# Patient Record
Sex: Male | Born: 1955 | Race: White | Hispanic: No | Marital: Married | Smoking: Former smoker
Health system: Southern US, Community
[De-identification: ages and names within clinical notes are randomized; demographics above are authoritative.]

## PROBLEM LIST (undated history)

## (undated) DIAGNOSIS — G4700 Insomnia, unspecified: Secondary | ICD-10-CM

## (undated) DIAGNOSIS — I10 Essential (primary) hypertension: Secondary | ICD-10-CM

## (undated) DIAGNOSIS — E039 Hypothyroidism, unspecified: Secondary | ICD-10-CM

## (undated) DIAGNOSIS — K388 Other specified diseases of appendix: Secondary | ICD-10-CM

## (undated) DIAGNOSIS — E119 Type 2 diabetes mellitus without complications: Secondary | ICD-10-CM

## (undated) DIAGNOSIS — F329 Major depressive disorder, single episode, unspecified: Secondary | ICD-10-CM

## (undated) DIAGNOSIS — B182 Chronic viral hepatitis C: Secondary | ICD-10-CM

---

## 1999-06-24 ENCOUNTER — Encounter (INDEPENDENT_AMBULATORY_CARE_PROVIDER_SITE_OTHER): Payer: Self-pay | Admitting: Specialist

## 1999-06-24 ENCOUNTER — Ambulatory Visit (HOSPITAL_COMMUNITY): Admission: RE | Admit: 1999-06-24 | Discharge: 1999-06-24 | Payer: Self-pay | Admitting: Gastroenterology

## 2004-01-06 ENCOUNTER — Ambulatory Visit (HOSPITAL_COMMUNITY): Admission: RE | Admit: 2004-01-06 | Discharge: 2004-01-06 | Payer: Self-pay | Admitting: *Deleted

## 2004-01-06 ENCOUNTER — Encounter (INDEPENDENT_AMBULATORY_CARE_PROVIDER_SITE_OTHER): Payer: Self-pay | Admitting: *Deleted

## 2005-12-31 ENCOUNTER — Ambulatory Visit (HOSPITAL_COMMUNITY): Admission: RE | Admit: 2005-12-31 | Discharge: 2005-12-31 | Payer: Self-pay | Admitting: Gastroenterology

## 2005-12-31 ENCOUNTER — Encounter (INDEPENDENT_AMBULATORY_CARE_PROVIDER_SITE_OTHER): Payer: Self-pay | Admitting: Specialist

## 2006-03-07 ENCOUNTER — Ambulatory Visit (HOSPITAL_COMMUNITY): Admission: RE | Admit: 2006-03-07 | Discharge: 2006-03-07 | Payer: Self-pay | Admitting: Gastroenterology

## 2006-10-12 ENCOUNTER — Ambulatory Visit (HOSPITAL_COMMUNITY): Admission: RE | Admit: 2006-10-12 | Discharge: 2006-10-12 | Payer: Self-pay | Admitting: Gastroenterology

## 2010-01-16 ENCOUNTER — Encounter: Admission: RE | Admit: 2010-01-16 | Discharge: 2010-01-16 | Payer: Self-pay | Admitting: Internal Medicine

## 2010-01-29 ENCOUNTER — Inpatient Hospital Stay (HOSPITAL_COMMUNITY): Admission: EM | Admit: 2010-01-29 | Discharge: 2010-02-01 | Payer: Self-pay | Admitting: Emergency Medicine

## 2010-01-29 ENCOUNTER — Ambulatory Visit: Payer: Self-pay | Admitting: Pulmonary Disease

## 2010-02-06 ENCOUNTER — Encounter: Admission: RE | Admit: 2010-02-06 | Discharge: 2010-02-06 | Payer: Self-pay | Admitting: Gastroenterology

## 2010-02-25 ENCOUNTER — Encounter: Admission: RE | Admit: 2010-02-25 | Discharge: 2010-02-25 | Payer: Self-pay | Admitting: Gastroenterology

## 2011-03-19 LAB — URINALYSIS, ROUTINE W REFLEX MICROSCOPIC
Glucose, UA: >1000 mg/dL — AB
Ketones, ur: 15 mg/dL — AB
Leukocytes, UA: NEGATIVE
Nitrite: POSITIVE — AB
Protein, ur: 100 mg/dL — AB
Specific Gravity, Urine: 1.035 — ABNORMAL HIGH (ref 1.005–1.030)
Urobilinogen, UA: 4 mg/dL — ABNORMAL HIGH (ref 0.0–1.0)
pH: 5.5 (ref 5.0–8.0)

## 2011-03-19 LAB — CBC
HCT: 39.6 % (ref 39.0–52.0)
HCT: 41.4 % (ref 39.0–52.0)
HCT: 45.2 % (ref 39.0–52.0)
Hemoglobin: 13.7 g/dL (ref 13.0–17.0)
Hemoglobin: 14.5 g/dL (ref 13.0–17.0)
Hemoglobin: 16.3 g/dL (ref 13.0–17.0)
MCHC: 34.6 g/dL (ref 30.0–36.0)
MCHC: 35.1 g/dL (ref 30.0–36.0)
MCHC: 36 g/dL (ref 30.0–36.0)
MCV: 93.2 fL (ref 78.0–100.0)
MCV: 93.3 fL (ref 78.0–100.0)
MCV: 94.4 fL (ref 78.0–100.0)
Platelets: 108 10*3/uL — ABNORMAL LOW (ref 150–400)
Platelets: 125 10*3/uL — ABNORMAL LOW (ref 150–400)
RBC: 4.24 MIL/uL (ref 4.22–5.81)
RBC: 4.38 MIL/uL (ref 4.22–5.81)
RBC: 4.85 MIL/uL (ref 4.22–5.81)
RDW: 12.2 % (ref 11.5–15.5)
RDW: 12.9 % (ref 11.5–15.5)
WBC: 11.8 10*3/uL — ABNORMAL HIGH (ref 4.0–10.5)
WBC: 8.9 K/uL (ref 4.0–10.5)

## 2011-03-19 LAB — POCT I-STAT 3, ART BLOOD GAS (G3+)
Acid-base deficit: 2 mmol/L (ref 0.0–2.0)
Bicarbonate: 19.7 mEq/L — ABNORMAL LOW (ref 20.0–24.0)
O2 Saturation: 100 %
O2 Saturation: 100 %
TCO2: 20 mmol/L (ref 0–100)
TCO2: 28 mmol/L (ref 0–100)
pCO2 arterial: 22.4 mmHg — ABNORMAL LOW (ref 35.0–45.0)
pCO2 arterial: 56.5 mmHg — ABNORMAL HIGH (ref 35.0–45.0)
pO2, Arterial: 155 mmHg — ABNORMAL HIGH (ref 80.0–100.0)

## 2011-03-19 LAB — COMPREHENSIVE METABOLIC PANEL
ALT: 78 U/L — ABNORMAL HIGH (ref 0–53)
ALT: 83 U/L — ABNORMAL HIGH (ref 0–53)
ALT: 97 U/L — ABNORMAL HIGH (ref 0–53)
AST: 60 U/L — ABNORMAL HIGH (ref 0–37)
Albumin: 2.8 g/dL — ABNORMAL LOW (ref 3.5–5.2)
Albumin: 3 g/dL — ABNORMAL LOW (ref 3.5–5.2)
Alkaline Phosphatase: 58 U/L (ref 39–117)
Alkaline Phosphatase: 63 U/L (ref 39–117)
BUN: 19 mg/dL (ref 6–23)
CO2: 20 mEq/L (ref 19–32)
CO2: 24 mEq/L (ref 19–32)
Calcium: 8.2 mg/dL — ABNORMAL LOW (ref 8.4–10.5)
Calcium: 8.6 mg/dL (ref 8.4–10.5)
Chloride: 110 mEq/L (ref 96–112)
Creatinine, Ser: 0.85 mg/dL (ref 0.4–1.5)
GFR calc Af Amer: >60 mL/min (ref >60)
GFR calc Af Amer: >60 mL/min (ref >60)
GFR calc non Af Amer: >60 mL/min (ref >60)
Glucose, Bld: 227 mg/dL — ABNORMAL HIGH (ref 70–99)
Glucose, Bld: 472 mg/dL — ABNORMAL HIGH (ref 70–99)
Potassium: 2.6 mEq/L — CL (ref 3.5–5.1)
Potassium: 3.4 mEq/L — ABNORMAL LOW (ref 3.5–5.1)
Sodium: 135 mEq/L (ref 135–145)
Sodium: 141 mEq/L (ref 135–145)
Total Bilirubin: 1 mg/dL (ref 0.3–1.2)
Total Protein: 5.9 g/dL — ABNORMAL LOW (ref 6.0–8.3)

## 2011-03-19 LAB — BASIC METABOLIC PANEL
BUN: 10 mg/dL (ref 6–23)
BUN: 15 mg/dL (ref 6–23)
CO2: 26 mEq/L (ref 19–32)
Calcium: 8.3 mg/dL — ABNORMAL LOW (ref 8.4–10.5)
Chloride: 111 mEq/L (ref 96–112)
GFR calc Af Amer: >60 mL/min (ref >60)
GFR calc non Af Amer: >60 mL/min (ref >60)
Glucose, Bld: 149 mg/dL — ABNORMAL HIGH (ref 70–99)
Glucose, Bld: 247 mg/dL — ABNORMAL HIGH (ref 70–99)
Potassium: 3.1 mEq/L — ABNORMAL LOW (ref 3.5–5.1)
Sodium: 141 mEq/L (ref 135–145)

## 2011-03-19 LAB — GLUCOSE, CAPILLARY
Glucose-Capillary: 145 mg/dL — ABNORMAL HIGH (ref 70–99)
Glucose-Capillary: 147 mg/dL — ABNORMAL HIGH (ref 70–99)
Glucose-Capillary: 158 mg/dL — ABNORMAL HIGH (ref 70–99)
Glucose-Capillary: 158 mg/dL — ABNORMAL HIGH (ref 70–99)
Glucose-Capillary: 169 mg/dL — ABNORMAL HIGH (ref 70–99)
Glucose-Capillary: 170 mg/dL — ABNORMAL HIGH (ref 70–99)
Glucose-Capillary: 171 mg/dL — ABNORMAL HIGH (ref 70–99)
Glucose-Capillary: 181 mg/dL — ABNORMAL HIGH (ref 70–99)
Glucose-Capillary: 201 mg/dL — ABNORMAL HIGH (ref 70–99)
Glucose-Capillary: 215 mg/dL — ABNORMAL HIGH (ref 70–99)
Glucose-Capillary: 234 mg/dL — ABNORMAL HIGH (ref 70–99)
Glucose-Capillary: 236 mg/dL — ABNORMAL HIGH (ref 70–99)
Glucose-Capillary: 261 mg/dL — ABNORMAL HIGH (ref 70–99)
Glucose-Capillary: 273 mg/dL — ABNORMAL HIGH (ref 70–99)
Glucose-Capillary: 286 mg/dL — ABNORMAL HIGH (ref 70–99)
Glucose-Capillary: 301 mg/dL — ABNORMAL HIGH (ref 70–99)
Glucose-Capillary: 311 mg/dL — ABNORMAL HIGH (ref 70–99)
Glucose-Capillary: 324 mg/dL — ABNORMAL HIGH (ref 70–99)
Glucose-Capillary: 356 mg/dL — ABNORMAL HIGH (ref 70–99)
Glucose-Capillary: 360 mg/dL — ABNORMAL HIGH (ref 70–99)
Glucose-Capillary: 361 mg/dL — ABNORMAL HIGH (ref 70–99)

## 2011-03-19 LAB — COMPREHENSIVE METABOLIC PANEL WITH GFR
AST: 58 U/L — ABNORMAL HIGH (ref 0–37)
BUN: 14 mg/dL (ref 6–23)
BUN: 17 mg/dL (ref 6–23)
CO2: 23 meq/L (ref 19–32)
Calcium: 8.1 mg/dL — ABNORMAL LOW (ref 8.4–10.5)
Chloride: 107 meq/L (ref 96–112)
Creatinine, Ser: 0.66 mg/dL (ref 0.4–1.5)
Creatinine, Ser: 0.8 mg/dL (ref 0.4–1.5)
GFR calc non Af Amer: >60 mL/min (ref >60)
GFR calc non Af Amer: >60 mL/min (ref >60)
Glucose, Bld: 128 mg/dL — ABNORMAL HIGH (ref 70–99)
Total Bilirubin: 1.1 mg/dL (ref 0.3–1.2)
Total Protein: 5.8 g/dL — ABNORMAL LOW (ref 6.0–8.3)

## 2011-03-19 LAB — URINE MICROSCOPIC-ADD ON

## 2011-03-19 LAB — URINE CULTURE
Colony Count: NO GROWTH
Culture: NO GROWTH
Culture: NO GROWTH

## 2011-03-19 LAB — BASIC METABOLIC PANEL WITH GFR
CO2: 21 meq/L (ref 19–32)
Calcium: 7.9 mg/dL — ABNORMAL LOW (ref 8.4–10.5)
Creatinine, Ser: 0.78 mg/dL (ref 0.4–1.5)
Potassium: 3.7 meq/L (ref 3.5–5.1)
Sodium: 137 meq/L (ref 135–145)

## 2011-03-19 LAB — HEMOGLOBIN A1C
Hgb A1c MFr Bld: 11.9 % — ABNORMAL HIGH (ref 4.6–6.1)
Mean Plasma Glucose: 295 mg/dL

## 2011-03-19 LAB — POCT I-STAT, CHEM 8
BUN: 31 mg/dL — ABNORMAL HIGH (ref 6–23)
Calcium, Ion: 1.1 mmol/L — ABNORMAL LOW (ref 1.12–1.32)
Chloride: 108 mEq/L (ref 96–112)
Glucose, Bld: 524 mg/dL — ABNORMAL HIGH (ref 70–99)

## 2011-03-19 LAB — DIFFERENTIAL
Eosinophils Absolute: 0.1 10*3/uL (ref 0.0–0.7)
Lymphs Abs: 1.2 10*3/uL (ref 0.7–4.0)
Neutrophils Relative %: 80 % — ABNORMAL HIGH (ref 43–77)

## 2011-03-19 LAB — RAPID URINE DRUG SCREEN, HOSP PERFORMED: Tetrahydrocannabinol: NOT DETECTED

## 2011-03-19 LAB — AMMONIA
Ammonia: 101 umol/L — ABNORMAL HIGH (ref 11–35)
Ammonia: 37 umol/L — ABNORMAL HIGH (ref 11–35)
Ammonia: 43 umol/L — ABNORMAL HIGH (ref 11–35)
Ammonia: 71 umol/L — ABNORMAL HIGH (ref 11–35)

## 2011-03-19 LAB — MRSA PCR SCREENING: MRSA by PCR: NEGATIVE

## 2011-03-19 LAB — LACTIC ACID, PLASMA: Lactic Acid, Venous: 5.4 mmol/L — ABNORMAL HIGH (ref 0.5–2.2)

## 2011-03-19 LAB — TSH: TSH: 1.866 u[IU]/mL (ref 0.350–4.500)

## 2011-03-19 LAB — PROTIME-INR: Prothrombin Time: 13.3 seconds (ref 11.6–15.2)

## 2011-03-19 LAB — LIPASE, BLOOD: Lipase: 68 U/L — ABNORMAL HIGH (ref 11–59)

## 2011-05-14 NOTE — Op Note (Signed)
[name], [name]                ACCOUNT NO.:  [id number]   MEDICAL RECORD NO.:  [id number]          PATIENT TYPE:  AMB   LOCATION:  ENDO                         FACILITY:  [facility]   PHYSICIAN:  Shirley Friar, MDDATE OF BIRTH:  1956-03-24   DATE OF PROCEDURE:  12/31/2005  DATE OF DISCHARGE:                                 OPERATIVE REPORT   PROCEDURE:  Sigmoidoscopy.   INDICATIONS FOR PROCEDURE:  Chronic diarrhea.   MEDICATIONS:  None.   FINDINGS:  Rectal exam was normal. A pediatric adjustable colonoscope was  inserted and advanced to 60 cm. On careful withdrawal, the colonoscope  revealed scattered ulcers, inflammation and edema in the rectum up to  approximately 10 cm consistent with proctitis. The mucosal lining was  otherwise normal on the left side. Random biopsies were taken to rule out  colitis.   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   PLAN:  1.  Start Rowasa enemas 4 grams q.h.s.  2.  Followup on path.  3.  Followup in my office in 3-4 weeks and my office will schedule.      Shirley Friar, MD  Electronically Signed     VCS/MEDQ  D:  12/31/2005  T:  12/31/2005  Job:  [id number]   cc:   Erskine Speed, M.D.  Fax: [phone]

## 2012-07-31 ENCOUNTER — Other Ambulatory Visit: Payer: Self-pay | Admitting: Gastroenterology

## 2013-11-14 ENCOUNTER — Other Ambulatory Visit: Payer: Self-pay | Admitting: Internal Medicine

## 2013-11-14 DIAGNOSIS — B1920 Unspecified viral hepatitis C without hepatic coma: Secondary | ICD-10-CM

## 2013-11-20 ENCOUNTER — Ambulatory Visit
Admission: RE | Admit: 2013-11-20 | Discharge: 2013-11-20 | Disposition: A | Discharge status: Home / Self Care | Payer: 59 | Source: Ambulatory Visit | Attending: Internal Medicine | Admitting: Internal Medicine

## 2013-11-20 DIAGNOSIS — B1920 Unspecified viral hepatitis C without hepatic coma: Secondary | ICD-10-CM

## 2014-03-13 ENCOUNTER — Other Ambulatory Visit: Payer: Self-pay | Admitting: Nurse Practitioner

## 2014-03-13 DIAGNOSIS — C220 Liver cell carcinoma: Secondary | ICD-10-CM

## 2014-06-19 ENCOUNTER — Encounter (INDEPENDENT_AMBULATORY_CARE_PROVIDER_SITE_OTHER): Payer: Self-pay

## 2014-06-19 ENCOUNTER — Ambulatory Visit
Admission: RE | Admit: 2014-06-19 | Discharge: 2014-06-19 | Disposition: A | Discharge status: Home / Self Care | Payer: 59 | Source: Ambulatory Visit | Attending: Nurse Practitioner | Admitting: Nurse Practitioner

## 2014-06-19 DIAGNOSIS — C220 Liver cell carcinoma: Secondary | ICD-10-CM

## 2014-08-28 ENCOUNTER — Other Ambulatory Visit: Payer: Self-pay | Admitting: Nurse Practitioner

## 2014-08-28 DIAGNOSIS — C220 Liver cell carcinoma: Secondary | ICD-10-CM

## 2014-11-27 ENCOUNTER — Ambulatory Visit
Admission: RE | Admit: 2014-11-27 | Discharge: 2014-11-27 | Disposition: A | Discharge status: Home / Self Care | Payer: 59 | Source: Ambulatory Visit | Attending: Nurse Practitioner | Admitting: Nurse Practitioner

## 2014-11-27 DIAGNOSIS — C220 Liver cell carcinoma: Secondary | ICD-10-CM

## 2015-03-26 ENCOUNTER — Other Ambulatory Visit (HOSPITAL_COMMUNITY): Payer: Self-pay | Admitting: Nurse Practitioner

## 2015-03-26 DIAGNOSIS — B182 Chronic viral hepatitis C: Secondary | ICD-10-CM

## 2015-04-10 ENCOUNTER — Ambulatory Visit (HOSPITAL_COMMUNITY)
Admission: RE | Admit: 2015-04-10 | Discharge: 2015-04-10 | Disposition: A | Discharge status: Home / Self Care | Payer: 59 | Source: Ambulatory Visit | Attending: Nurse Practitioner | Admitting: Nurse Practitioner

## 2015-04-10 DIAGNOSIS — K824 Cholesterolosis of gallbladder: Secondary | ICD-10-CM | POA: Diagnosis not present

## 2015-04-10 DIAGNOSIS — B182 Chronic viral hepatitis C: Secondary | ICD-10-CM | POA: Diagnosis not present

## 2016-02-15 ENCOUNTER — Emergency Department (INDEPENDENT_AMBULATORY_CARE_PROVIDER_SITE_OTHER): Payer: 59

## 2016-02-15 ENCOUNTER — Emergency Department (HOSPITAL_COMMUNITY)
Admission: EM | Admit: 2016-02-15 | Discharge: 2016-02-15 | Disposition: A | Discharge status: Home / Self Care | Payer: 59 | Source: Home / Self Care | Attending: Family Medicine | Admitting: Family Medicine

## 2016-02-15 ENCOUNTER — Encounter (HOSPITAL_COMMUNITY): Payer: Self-pay | Admitting: Emergency Medicine

## 2016-02-15 DIAGNOSIS — M79602 Pain in left arm: Secondary | ICD-10-CM

## 2016-02-15 DIAGNOSIS — M79605 Pain in left leg: Secondary | ICD-10-CM | POA: Diagnosis not present

## 2016-02-15 HISTORY — DX: Chronic viral hepatitis C: B18.2

## 2016-02-15 HISTORY — DX: Type 2 diabetes mellitus without complications: E11.9

## 2016-02-15 MED ORDER — KETOROLAC TROMETHAMINE 60 MG/2ML IM SOLN
INTRAMUSCULAR | Status: AC
  Filled 2016-02-15: qty 2

## 2016-02-15 MED ORDER — KETOROLAC TROMETHAMINE 60 MG/2ML IM SOLN
60.0000 mg | Freq: Once | INTRAMUSCULAR | Status: AC
  Administered 2016-02-15: 60 mg via INTRAMUSCULAR

## 2016-02-15 MED ORDER — NAPROXEN 500 MG PO TABS: 500.0000 mg | ORAL_TABLET | Freq: Two times a day (BID) | ORAL | Status: DC

## 2016-02-15 NOTE — Discharge Instructions (Signed)
It was a pleasure to see you today for the pain in your left leg.  The x-ray does not show evidence of a fracture.  I suspect you have had a small bleed into the muscle in your left lower leg.  NSAIDs, such as NAPROXEN 500mg  tablets, 1 tablet by mouth every 12 hours (start this evening, as you received an injectable NSAID-- Toradol 60mg -- in the [facility] [place]).   Warm compresses to the area periodically.  Follow up with Dr Nyoka Cowden this week as needed if the area becomes larger, more painful, or with other concerns or problems.
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
## 2016-06-24 ENCOUNTER — Other Ambulatory Visit: Payer: Self-pay | Admitting: Nurse Practitioner

## 2016-06-24 DIAGNOSIS — K7469 Other cirrhosis of liver: Secondary | ICD-10-CM

## 2016-07-06 ENCOUNTER — Ambulatory Visit
Admission: RE | Admit: 2016-07-06 | Discharge: 2016-07-06 | Disposition: A | Discharge status: Home / Self Care | Payer: 59 | Source: Ambulatory Visit | Attending: Nurse Practitioner | Admitting: Nurse Practitioner

## 2016-07-06 DIAGNOSIS — K7469 Other cirrhosis of liver: Secondary | ICD-10-CM

## 2016-09-17 ENCOUNTER — Other Ambulatory Visit: Payer: Self-pay | Admitting: Internal Medicine

## 2016-09-17 DIAGNOSIS — R079 Chest pain, unspecified: Secondary | ICD-10-CM

## 2016-09-20 ENCOUNTER — Ambulatory Visit
Admission: RE | Admit: 2016-09-20 | Discharge: 2016-09-20 | Disposition: A | Discharge status: Home / Self Care | Payer: 59 | Source: Ambulatory Visit | Attending: Internal Medicine | Admitting: Internal Medicine

## 2016-09-20 DIAGNOSIS — R079 Chest pain, unspecified: Secondary | ICD-10-CM

## 2016-12-29 ENCOUNTER — Other Ambulatory Visit: Payer: Self-pay | Admitting: Nurse Practitioner

## 2016-12-29 DIAGNOSIS — K7469 Other cirrhosis of liver: Secondary | ICD-10-CM

## 2016-12-31 ENCOUNTER — Ambulatory Visit
Admission: RE | Admit: 2016-12-31 | Discharge: 2016-12-31 | Disposition: A | Discharge status: Home / Self Care | Payer: 59 | Source: Ambulatory Visit | Attending: Nurse Practitioner | Admitting: Nurse Practitioner

## 2016-12-31 DIAGNOSIS — K7469 Other cirrhosis of liver: Secondary | ICD-10-CM

## 2017-05-20 ENCOUNTER — Ambulatory Visit (INDEPENDENT_AMBULATORY_CARE_PROVIDER_SITE_OTHER): Payer: 59 | Admitting: Internal Medicine

## 2017-05-20 ENCOUNTER — Encounter: Payer: 59 | Admitting: Internal Medicine

## 2017-05-20 DIAGNOSIS — Z23 Encounter for immunization: Secondary | ICD-10-CM | POA: Diagnosis not present

## 2017-05-20 DIAGNOSIS — Z789 Other specified health status: Secondary | ICD-10-CM | POA: Diagnosis not present

## 2017-05-20 DIAGNOSIS — Z9189 Other specified personal risk factors, not elsewhere classified: Secondary | ICD-10-CM

## 2017-05-20 DIAGNOSIS — Z7189 Other specified counseling: Secondary | ICD-10-CM | POA: Diagnosis not present

## 2017-05-20 DIAGNOSIS — Z7184 Encounter for health counseling related to travel: Secondary | ICD-10-CM | POA: Insufficient documentation

## 2017-05-20 DIAGNOSIS — Z0000 Encounter for general adult medical examination without abnormal findings: Secondary | ICD-10-CM

## 2017-05-20 MED ORDER — ATOVAQUONE-PROGUANIL HCL 250-100 MG PO TABS
1.0000 | ORAL_TABLET | Freq: Every day | ORAL | 0 refills | Status: DC
Start: 2017-05-20 — End: 2018-03-13

## 2017-05-20 MED ORDER — CIPROFLOXACIN HCL 500 MG PO TABS: 500.0000 mg | ORAL_TABLET | Freq: Two times a day (BID) | ORAL | 0 refills | Status: DC

## 2017-05-20 NOTE — Patient Instructions (Signed)
[facility]                [address], [place]                   [place],  [address]                      Phone: [phone]                        Fax: [phone]   Planned departure date: July 2018          Planned return date: 2 weeks Countries of travel: [place]   Guidelines for the Prevention & Treatment of Traveler's Diarrhea  Prevention: "Boil it, Peel it, Lacinda Axon it, or Forget it"   the fewer chances -> lower risk: try to stick to food & water precautions as much as possible"   If it's "piping hot"; it is probably okay, if not, it may not be   Treatment   1) You should always take care to drink lots of fluids in order to avoid dehydration   2) You should bring medications with you in case you come down with a case of diarrhea   3) OTC = bring pepto-bismol - can take with initial abdominal symptoms;                    Imodium - can help slow down your intestinal tract, can help relief cramps                    and diarrhea, can take if no bloody diarrhea  Use ciprofloxacin if needed for traveler's diarrhea  Guidelines for the Prevention of Malaria  Avoidance:  -fewer mosquito bites = lower risk. Mosquitos can bite at night as well as daytime  -cover up (long sleeve clothing), mosquito nets, screens  -Insect repellent for your skin ( DEET containing lotion > 20%): for clothes ( permethrin spray)   2 days prior to travel, start malarone, daily dose starting 1-2 days before entering endemic area, ending 7 days after leaving area for malaria prevention.   Immunizations received today: Hepatitis A series, Td, Typhoid (parenteral) and will get yellow fever  Future immunizations, if indicated Hepatitis A series in 6-12 months after November 20, 2017   Prior to travel:  1) Be sure to pick up appropriate prescriptions, including medicine you take daily. Do not expect to be able to fill your prescriptions abroad.  2)  Strongly consider obtaining traveler's insurance, including emergency evacuation insurance. Most plans in the [place] do not cover participants abroad. (see below for resources)  3) Register at the appropriate [place] embassy or consulate with travel dates so they are aware of your presence in-country and for helpful advice during travel using the [employer] (STEP, GreenNylon.com.cy).  4) Leave contact information with a relative or friend.  5) Keep a Research officer, political party, credit cards in case they become lost or stolen  6) Inform your credit card company that you will be travelling abroad   During travel:  1) If you become ill and need medical advice, the [place] [employer]	[place] of the country you are traveling in general provides a list of Dick[place] speaking doctors.  We are also available on MyChart for remote consultation if you register prior to travel. 2) Avoid motorcycles or scooters when at all  possible. Traffic laws in many countries are lax and accidents occur frequently.  3) Do not take any unnecessary risks that you wouldn't do at home.   Resources:  -Country specific information: BlindResource.ca or GreenNylon.com.cy  -Press photographer (DEET, mosquito nets): [employer], [employer] store, [employer], [place] insurance options: gatewayplans.com; http://clayton-rivera.info/; travelguard.com or [employer], gninsurance.com or info@gninsurance .com, [id number].   Post Travel:  If you return from your trip ill, call your primary care doctor or our travel clinic @ [phone].   Enjoy your trip and know that with proper pre-travel preparation, most people have an enjoyable and uninterrupted trip!

## 2017-05-20 NOTE — Progress Notes (Signed)
Subjective:   [name] [name] is a 61 y.o. male who presents to the Infectious Disease clinic for travel consultation. Planned departure date: July 2018          Planned return date: 2 weeks Countries of travel: [place] Areas in country: urban   Accommodations: private home Purpose of travel: vacation Prior travel out of [place]: yes     Objective:   Medications: reviewed    Assessment:   No contraindications to travel none     Plan:    Issues discussed: environmental concerns, freshwater swimming, future shots, insect-borne illnesses, malaria, MVA safety, rabies, safe food/water, traveler's diarrhea, website/handouts for more information, what to do if ill upon return, what to do if ill while there and Yellow Fever. Immunizations recommended: Hepatitis A series, Td, Typhoid (parenteral) and Yellow Fever. Malaria prophylaxis: malarone, daily dose starting 1-2 days before entering endemic area, ending 7 days after leaving area Traveler's diarrhea prophylaxis: ciprofloxacin. Total duration of visit: 1 Hour. Total time spent on education, counseling, coordination of care: 30 Minutes.

## 2017-07-28 ENCOUNTER — Other Ambulatory Visit: Payer: Self-pay | Admitting: Nurse Practitioner

## 2017-07-28 DIAGNOSIS — K7469 Other cirrhosis of liver: Secondary | ICD-10-CM

## 2017-08-23 ENCOUNTER — Ambulatory Visit
Admission: RE | Admit: 2017-08-23 | Discharge: 2017-08-23 | Disposition: A | Discharge status: Home / Self Care | Payer: 59 | Source: Ambulatory Visit | Attending: Nurse Practitioner | Admitting: Nurse Practitioner

## 2017-08-23 DIAGNOSIS — K7469 Other cirrhosis of liver: Secondary | ICD-10-CM

## 2017-12-27 HISTORY — PX: COLONOSCOPY: SHX174

## 2018-01-10 ENCOUNTER — Other Ambulatory Visit: Payer: Self-pay | Admitting: Gastroenterology

## 2018-01-10 DIAGNOSIS — K51219 Ulcerative (chronic) proctitis with unspecified complications: Secondary | ICD-10-CM

## 2018-01-22 ENCOUNTER — Other Ambulatory Visit: Payer: Self-pay | Admitting: Nurse Practitioner

## 2018-01-22 DIAGNOSIS — K7469 Other cirrhosis of liver: Secondary | ICD-10-CM

## 2018-01-27 ENCOUNTER — Ambulatory Visit
Admission: RE | Admit: 2018-01-27 | Discharge: 2018-01-27 | Disposition: A | Discharge status: Home / Self Care | Payer: 59 | Source: Ambulatory Visit | Attending: Nurse Practitioner | Admitting: Nurse Practitioner

## 2018-01-27 DIAGNOSIS — K7469 Other cirrhosis of liver: Secondary | ICD-10-CM

## 2018-01-31 ENCOUNTER — Ambulatory Visit
Admission: RE | Admit: 2018-01-31 | Discharge: 2018-01-31 | Disposition: A | Discharge status: Home / Self Care | Payer: 59 | Source: Ambulatory Visit | Attending: Gastroenterology | Admitting: Gastroenterology

## 2018-01-31 DIAGNOSIS — K51219 Ulcerative (chronic) proctitis with unspecified complications: Secondary | ICD-10-CM

## 2018-01-31 MED ORDER — IOPAMIDOL (ISOVUE-300) INJECTION 61%
125.0000 mL | Freq: Once | INTRAVENOUS | Status: AC | PRN
  Administered 2018-01-31: 125 mL via INTRAVENOUS

## 2018-02-20 ENCOUNTER — Ambulatory Visit: Payer: Self-pay | Admitting: Surgery

## 2018-02-20 ENCOUNTER — Encounter: Payer: Self-pay | Admitting: Surgery

## 2018-02-20 DIAGNOSIS — K388 Other specified diseases of appendix: Secondary | ICD-10-CM

## 2018-02-20 HISTORY — DX: Other specified diseases of appendix: K38.8

## 2018-02-20 NOTE — H&P (Signed)
Javoni Lucken Koskinen Documented: 02/20/2018 3:02 PM Location: [facility] Patient #: [id number] DOB: 11/24/1956 Married / Language: Cleophus Molt / Race: White Male  History of Present Illness Adin Hector MD; 02/20/2018 3:45 PM) The patient is a 62 year old male who presents with a colonic mass. Note for "Colonic mass": ` ` ` Patient sent for surgical consultation at the request of Dr. 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 CT scan suspicious for a mucocele. Rather large. Surgical consultation requested. He has a history of colon polyps. 5 years ago, no major issues. 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 No medications are treatments for that. He's never had abdominal surgery. He walks rather regularly. 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 Moves his bowels most days. He does not smoke. He is a diabetic controlled with oral hypoglycemics. Hemoglobin A1c in the mid fives.  No personal nor family history of GI/colon cancer, Crohn's disease, allergy such as Celiac Sprue, dietary/dairy problems, ulcers, nor gastritis. No recent sick contacts/gastroenteritis. No travel outside the country. No changes in diet. No dysphagia to solids or liquids. No significant heartburn or reflux. No hematochezia, hematemesis, coffee ground emesis. No evidence of prior gastric/peptic ulceration.  (Review of systems as stated in this history (HPI) or in the review of systems. Otherwise all other 12 point ROS are negative)   Past Surgical History Levonne Spiller, [place]; 02/20/2018 3:02 PM) Colon Polyp Removal - Colonoscopy Oral Surgery Vasectomy  Diagnostic Studies History Levonne Spiller, CMA; 02/20/2018 3:02 PM) Colonoscopy  within last year  Allergies Levonne Spiller, [place]; 02/20/2018 3:02 PM) No Known Drug Allergies [02/20/2018]: Allergies Reconciled  Medication History Levonne Spiller, CMA; 02/20/2018 3:04 PM) ClonazePAM (0.5MG  Tablet, Oral) Active. BuPROPion HCl ER (XL) (300MG  Tablet ER 24HR, Oral) Active. Lisinopril (10MG  Tablet, Oral) Active. MetFORMIN HCl (500MG  Tablet, Oral) Active. Synthroid (88MCG Tablet, Oral) Active. Naproxen (250MG  Tablet, Oral) Active. Viagra (100MG  Tablet, Oral) Active. Excedrin Extra Strength (250-250-65MG  Tablet, Oral) Active. Medications Reconciled  Social History Andee Poles Education officer, museum, CMA; 02/20/2018 3:02 PM) Alcohol use Remotely quit alcohol use. Caffeine use Carbonated beverages, Coffee. Illicit drug use Uses socially only. Tobacco use Former smoker.  Family History Levonne Spiller, CMA; 02/20/2018 3:02 PM) Alcohol Abuse Brother, Sister. Cancer Brother. Diabetes Mellitus Father. Heart Disease Father. Hypertension Brother. Respiratory Condition Father, Mother.  Other Problems Levonne Spiller, CMA; 02/20/2018 3:02 PM) Anxiety Disorder Cirrhosis Of Liver Depression Diabetes Mellitus Hepatitis Seizure Disorder     Review of Systems Andee Poles Gerrigner CMA; 02/20/2018 3:02 PM) General Not Present- Appetite Loss, Chills, Fatigue, Fever, Night Sweats, Weight Gain and Weight Loss. Skin Present- Dryness. Not Present- Change in Wart/Mole, Hives, Jaundice, New Lesions, Non-Healing Wounds, Rash and Ulcer. HEENT Present- Seasonal Allergies and Wears glasses/contact lenses. Not Present- Earache, Hearing Loss, Hoarseness, Nose Bleed, Oral Ulcers, Ringing in the Ears, Sinus Pain, Sore Throat, Visual Disturbances and Yellow Eyes. Respiratory Present- Snoring. Not Present- Bloody sputum, Chronic Cough, Difficulty Breathing and Wheezing. Breast Not Present- Breast Mass, Breast Pain, Nipple Discharge and Skin Changes. Cardiovascular Not  Present- Chest Pain, Difficulty Breathing Lying Down, Leg Cramps, Palpitations, Rapid Heart Rate, Shortness of Breath and Swelling of Extremities. Gastrointestinal Not Present- Abdominal Pain, Bloating, Bloody Stool, Change in Bowel Habits, Chronic diarrhea, Constipation, Difficulty Swallowing, Excessive  gas, Gets full quickly at meals, Hemorrhoids, Indigestion, Nausea, Rectal Pain and Vomiting. Male Genitourinary Present- Impotence. Not Present- Blood in Urine, Change in Urinary Stream, Frequency, Nocturia, Painful Urination, Urgency and Urine Leakage. Musculoskeletal Not Present- Back Pain, Joint Pain, Joint Stiffness, Muscle Pain, Muscle Weakness and Swelling of Extremities. Neurological Present- Headaches. Not Present- Decreased Memory, Fainting, Numbness, Seizures, Tingling, Tremor, Trouble walking and Weakness. Psychiatric Present- Anxiety and Depression. Not Present- Bipolar, Change in Sleep Pattern, Fearful and Frequent crying. Hematology Present- Blood Thinners. Not Present- Easy Bruising, Excessive bleeding, Gland problems, HIV and Persistent Infections.  Vitals Andee Poles Gerrigner CMA; 02/20/2018 3:05 PM) 02/20/2018 3:04 PM Weight: 212 lb Height: 71in Body Surface Area: 2.16 m Body Mass Index: 29.57 kg/m  Temp.: 97.69F(Oral)  Pulse: 103 (Regular)  BP: 178/112 (Sitting, Right Arm, Standard)      Physical Exam Adin Hector MD; 02/20/2018 3:41 PM)  General Mental Status-Alert. General Appearance-Not in acute distress, Not Sickly. Orientation-Oriented X3. Hydration-Well hydrated. Voice-Normal.  Integumentary Global Assessment Upon inspection and palpation of skin surfaces of the - Axillae: non-tender, no inflammation or ulceration, no drainage. and Distribution of scalp and body hair is normal. General Characteristics Temperature - normal warmth is noted.  Head and Neck Head-normocephalic, atraumatic with no lesions or palpable  masses. Face Global Assessment - atraumatic, no absence of expression. Neck Global Assessment - no abnormal movements, no bruit auscultated on the right, no bruit auscultated on the left, no decreased range of motion, non-tender. Trachea-midline. Thyroid Gland Characteristics - non-tender.  Eye Eyeball - Left-Extraocular movements intact, No Nystagmus. Eyeball - Right-Extraocular movements intact, No Nystagmus. Cornea - Left-No Hazy. Cornea - Right-No Hazy. Sclera/Conjunctiva - Left-No scleral icterus, No Discharge. Sclera/Conjunctiva - Right-No scleral icterus, No Discharge. Pupil - Left-Direct reaction to light normal. Pupil - Right-Direct reaction to light normal. Note: Wears glasses. Vision corrected  ENMT Ears Pinna - Left - no drainage observed, no generalized tenderness observed. Right - no drainage observed, no generalized tenderness observed. Nose and Sinuses External Inspection of the Nose - no destructive lesion observed. Inspection of the nares - Left - quiet respiration. Right - quiet respiration. Mouth and Throat Lips - Upper Lip - no fissures observed, no pallor noted. Lower Lip - no fissures observed, no pallor noted. Nasopharynx - no discharge present. Oral Cavity/Oropharynx - Tongue - no dryness observed. Oral Mucosa - no cyanosis observed. Hypopharynx - no evidence of airway distress observed.  Chest and Lung Exam Inspection Movements - Normal and Symmetrical. Accessory muscles - No use of accessory muscles in breathing. Palpation Palpation of the chest reveals - Non-tender. Auscultation Breath sounds - Normal and Clear.  Cardiovascular Auscultation Rhythm - Regular. Murmurs & Other Heart Sounds - Auscultation of the heart reveals - No Murmurs and No Systolic Clicks.  Abdomen Inspection Inspection of the abdomen reveals - No Visible peristalsis and No Abnormal pulsations. Umbilicus - No Bleeding, No Urine  drainage. Palpation/Percussion Palpation and Percussion of the abdomen reveal - Soft, Non Tender, No Rebound tenderness, No Rigidity (guarding) and No Cutaneous hyperesthesia. Note: Abdomen soft. Nontender. Moderate diastases recti. Perhaps very small umbilical hernia through stalk only. Not distended. No umbilical or incisional hernias. No guarding.  Male Genitourinary Sexual Maturity Tanner 5 - Adult hair pattern and Adult penile size and shape. Note: No inguinal hernias. No lymphadenopathy.  Peripheral Vascular Upper Extremity Inspection - Left - No Cyanotic nailbeds, Not Ischemic. Right - No Cyanotic nailbeds, Not Ischemic.  Neurologic Neurologic evaluation reveals -normal attention span and  ability to concentrate, able to name objects and repeat phrases. Appropriate fund of knowledge , normal sensation and normal coordination. Mental Status Affect - not angry, not paranoid. Cranial Nerves-Normal Bilaterally. Gait-Normal.  Neuropsychiatric Mental status exam performed with findings of-able to articulate well with normal speech/language, rate, volume and coherence, thought content normal with ability to perform basic computations and apply abstract reasoning and no evidence of hallucinations, delusions, obsessions or homicidal/suicidal ideation.  Musculoskeletal Global Assessment Spine, Ribs and Pelvis - no instability, subluxation or laxity. Right Upper Extremity - no instability, subluxation or laxity.  Lymphatic Head & Neck  General Head & Neck Lymphatics: Bilateral - Description - No Localized lymphadenopathy. Axillary  General Axillary Region: Bilateral - Description - No Localized lymphadenopathy. Femoral & Inguinal  Generalized Femoral & Inguinal Lymphatics: Left - Description - No Localized lymphadenopathy. Right - Description - No Localized lymphadenopathy.    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 Confirmed by CT scan. I think he would benefit from resection. No evidence of rupture.  Given the large size and and protrusion of the cecum, I do not think I can barely do an appendectomy. Would plan ileocecectomy with intraportal anastomosis. Discuss my partner, Dr. Excell Seltzer, who agrees.  I'll long discussion with the patient and his spouse. Answer many questions. They expressed understanding and wish to proceed.   PREOP COLON - ENCOUNTER FOR PREOPERATIVE EXAMINATION FOR GENERAL SURGICAL PROCEDURE (Z01.818)  Current Plans You are being scheduled for surgery- Our schedulers will call you.  You should hear from our office's scheduling department within 5 working days about the location, date, and time of surgery. We try to make accommodations for patient's preferences in scheduling surgery, but sometimes the OR schedule or the surgeon's schedule prevents [place] from making those accommodations.  If you have not heard from our office [phone]) in 5 working days, call the office and ask for your surgeon's nurse.  If you have other questions about your diagnosis, plan, or surgery, call the office and ask for your surgeon's nurse.  Written instructions provided The anatomy & physiology of the digestive tract was discussed. The pathophysiology of the colon was discussed. Natural history risks without surgery was discussed. I feel the risks of no intervention will lead to serious problems that outweigh the operative risks; therefore, I recommended a partial colectomy to remove the pathology. Minimally invasive (Robotic/Laparoscopic) & open techniques were discussed.  Risks such as bleeding, infection, abscess, leak, reoperation, possible ostomy, hernia, heart attack, death, and other risks were discussed. I noted a good likelihood this will help address the problem. Goals of  post-operative recovery were discussed as well. Need for adequate nutrition, daily bowel regimen and healthy physical activity, to optimize recovery was noted as well. We will work to minimize complications. Educational materials were available as well. Questions were answered. The patient expresses understanding & wishes to proceed with surgery.  Pt Education - [facility] Colon Bowel Prep 2018 ERAS/Miralax/Antibiotics Started Neomycin Sulfate 500 MG Oral Tablet, 2 (two) Tablet SEE NOTE, #6, 02/20/2018, No Refill. Local Order: TAKE TWO TABLETS AT 2 PM, 3 PM, AND 10 PM THE DAY PRIOR TO SURGERY Started Flagyl 500 MG Oral Tablet, 2 (two) Tablet SEE NOTE, #6, 02/20/2018, No Refill. Local Order: Take at 2pm, 3pm, and 10pm the day prior to your colon operation Pt Education - Pamphlet Given - Laparoscopic Colorectal Surgery: discussed with patient  and provided information. Pt Education - [facility] Colectomy post-op instructions: discussed with patient and provided information.  DIASTASIS RECTI (M62.08)  Current Plans Pt Education - [facility] Diastasis Recti: discussed with patient and provided information.  Adin Hector, M.D., F.A.C.S. Gastrointestinal and Minimally Invasive Surgery [facility] [address], [place], [place] [address] [phone] Main / Paging

## 2018-03-17 NOTE — Patient Instructions (Addendum)
[name] [name]  03/17/2018   Your procedure is scheduled on: 03-31-18    Report to [facility] Main  Entrance at 530 AM. Have a seat in the Main Lobby. Please note there is a phone at the [facility]. Please call [phone] on that phone. Someone from Short Stay will come and get you from the Main Lobby and take you to Short Stay.   Call this number if you have problems the morning of surgery [phone]    Remember:   DRINK 2 Agency AT 1000 PM AND 1 PRESURGERY DRINK THE DAY OF THE    PROCEDURE 3 HOURS PRIOR TO SCHEDULED SURGERY.  NO SOLIDS AFTER MIDNIGHT THE DAY PRIOR TO THE SURGERY.    NOTHING BY MOUTH EXCEPT CLEAR LIQUIDS UNTIL THREE HOURS PRIOR TO SCHEDULED SURGERY. PLEASE FINISH  SURGERY    ENSURE DRINK PER SURGEON ORDER 3 HOURS PRIOR TO SCHEDULED SURGERY TIME WHICH NEEDS TO BE COMPLETED AT 4    4:30 AM.   Please consume a Clear Liquid Diet the Day of Prep  CLEAR LIQUID DIET   Foods Allowed                                                                     Foods Excluded  Coffee and tea, regular and decaf                             liquids that you cannot  Plain Jell-O in any flavor                                             see through such as: Fruit ices (not with fruit pulp)                                     milk, soups, orange juice  Iced Popsicles                                    All solid food Carbonated beverages, regular and diet                                    Cranberry, grape and apple juices Sports drinks like Gatorade Lightly seasoned clear broth or consume(fat free) Sugar, honey syrup  Sample Menu Breakfast                                Lunch                                     Supper Cranberry juice  Beef broth                            Chicken broth Jell-O                                     Grape juice                           Apple juice Coffee  or tea                        Jell-O                                      Popsicle                                                Coffee or tea                        Coffee or tea   Take these medicines the morning of surgery with A SIP OF WATER: Levothyroxine (Synthroid) and Bupropion (Wellbutrin). You may also bring and use your eyedrops as needed.                                    You may not have any metal on your body including hair pins and              piercings  Do not wear jewelry, lotions, powders or  deodorant             Men may shave face and neck.   Do not bring valuables to the hospital. [place].  Contacts, dentures or bridgework may not be worn into surgery.  Leave suitcase in the car. After surgery it may be brought to your room.                 Please read over the following fact sheets you were given: _____________________________________________________________________ How to Manage Your Diabetes Before and After Surgery  Why is it important to control my blood sugar before and after surgery? . Improving blood sugar levels before and after surgery helps healing and can limit problems. . A way of improving blood sugar control is eating a healthy diet by: o  Eating less sugar and carbohydrates o  Increasing activity/exercise o  Talking with your doctor about reaching your blood sugar goals . High blood sugars (greater than 180 mg/dL) can raise your risk of infections and slow your recovery, so you will need to focus on controlling your diabetes during the weeks before surgery. . Make sure that the doctor who takes care of your diabetes knows about your planned surgery including the date and location.  How do I manage my blood sugar before surgery? . Check your blood sugar at least 4 times a day, starting 2 days before surgery, to make  sure that the level is not too high or low. o Check your blood sugar the  morning of your surgery when you wake up and every 2 hours until you get to the Short Stay unit. . If your blood sugar is less than 70 mg/dL, you will need to treat for low blood sugar: o Do not take insulin. o Treat a low blood sugar (less than 70 mg/dL) with  cup of clear juice (cranberry or apple), 4 glucose tablets, OR glucose gel. o Recheck blood sugar in 15 minutes after treatment (to make sure it is greater than 70 mg/dL). If your blood sugar is not greater than 70 mg/dL on recheck, call [phone] for further instructions. . Report your blood sugar to the short stay nurse when you get to Short Stay.  . If you are admitted to the hospital after surgery: o Your blood sugar will be checked by the staff and you will probably be given insulin after surgery (instead of oral diabetes medicines) to make sure you have good blood sugar levels. o The goal for blood sugar control after surgery is 80-180 mg/dL.   WHAT DO I DO ABOUT MY DIABETES MEDICATION?  Marland Kitchen Do not take oral diabetes medicines (pills) the morning of surgery.  . THE DAY BEFORE SURGERY, take your usual dose of Metformin           [place] - Preparing for Surgery Before surgery, you can play an important role.  Because skin is not sterile, your skin needs to be as free of germs as possible.  You can reduce the number of germs on your skin by washing with CHG (chlorahexidine gluconate) soap before surgery.  CHG is an antiseptic cleaner which kills germs and bonds with the skin to continue killing germs even after washing. Please DO NOT use if you have an allergy to CHG or antibacterial soaps.  If your skin becomes reddened/irritated stop using the CHG and inform your nurse when you arrive at Short Stay. Do not shave (including legs and underarms) for at least 48 hours prior to the first CHG shower.  You may shave your face/neck. Please follow these instructions carefully:  1.  Shower with CHG Soap the night before surgery and  the  morning of Surgery.  2.  If you choose to wash your hair, wash your hair first as usual with your  normal  shampoo.  3.  After you shampoo, rinse your hair and body thoroughly to remove the  shampoo.                           4.  Use CHG as you would any other liquid soap.  You can apply chg directly  to the skin and wash                       Gently with a scrungie or clean washcloth.  5.  Apply the CHG Soap to your body ONLY FROM THE NECK DOWN.   Do not use on face/ open                           Wound or open sores. Avoid contact with eyes, ears mouth and genitals (private parts).                       Wash face,  Genitals (private parts) with  your normal soap.             6.  Wash thoroughly, paying special attention to the area where your surgery  will be performed.  7.  Thoroughly rinse your body with warm water from the neck down.  8.  DO NOT shower/wash with your normal soap after using and rinsing off  the CHG Soap.                9.  Pat yourself dry with a clean towel.            10.  Wear clean pajamas.            11.  Place clean sheets on your bed the night of your first shower and do not  sleep with pets. Day of Surgery : Do not apply any lotions/deodorants the morning of surgery.  Please wear clean clothes to the hospital/surgery center.  FAILURE TO FOLLOW THESE INSTRUCTIONS MAY RESULT IN THE CANCELLATION OF YOUR SURGERY PATIENT SIGNATURE_________________________________  NURSE SIGNATURE__________________________________  ________________________________________________________________________   Adam Phenix  An incentive spirometer is a tool that can help keep your lungs clear and active. This tool measures how well you are filling your lungs with each breath. Taking long deep breaths may help reverse or decrease the chance of developing breathing (pulmonary) problems (especially infection) following:  A long period of time when you are unable to move or be  active. BEFORE THE PROCEDURE   If the spirometer includes an indicator to show your best effort, your nurse or respiratory therapist will set it to a desired goal.  If possible, sit up straight or lean slightly forward. Try not to slouch.  Hold the incentive spirometer in an upright position. INSTRUCTIONS FOR USE  1. Sit on the edge of your bed if possible, or sit up as far as you can in bed or on a chair. 2. Hold the incentive spirometer in an upright position. 3. Breathe out normally. 4. Place the mouthpiece in your mouth and seal your lips tightly around it. 5. Breathe in slowly and as deeply as possible, raising the piston or the ball toward the top of the column. 6. Hold your breath for 3-5 seconds or for as long as possible. Allow the piston or ball to fall to the bottom of the column. 7. Remove the mouthpiece from your mouth and breathe out normally. 8. Rest for a few seconds and repeat Steps 1 through 7 at least 10 times every 1-2 hours when you are awake. Take your time and take a few normal breaths between deep breaths. 9. The spirometer may include an indicator to show your best effort. Use the indicator as a goal to work toward during each repetition. 10. After each set of 10 deep breaths, practice coughing to be sure your lungs are clear. If you have an incision (the cut made at the time of surgery), support your incision when coughing by placing a pillow or rolled up towels firmly against it. Once you are able to get out of bed, walk around indoors and cough well. You may stop using the incentive spirometer when instructed by your caregiver.  RISKS AND COMPLICATIONS  Take your time so you do not get dizzy or light-headed.  If you are in pain, you may need to take or ask for pain medication before doing incentive spirometry. It is harder to take a deep breath if you are having pain. AFTER USE  Rest and breathe slowly  and easily.  It can be helpful to keep track of a log of  your progress. Your caregiver can provide you with a simple table to help with this. If you are using the spirometer at home, follow these instructions: [place] IF:   You are having difficultly using the spirometer.  You have trouble using the spirometer as often as instructed.  Your pain medication is not giving enough relief while using the spirometer.  You develop fever of 100.5 F (38.1 C) or higher. SEEK IMMEDIATE MEDICAL CARE IF:   You cough up bloody sputum that had not been present before.  You develop fever of 102 F (38.9 C) or greater.  You develop worsening pain at or near the incision site. MAKE SURE YOU:   Understand these instructions.  Will watch your condition.  Will get help right away if you are not doing well or get worse. Document Released: 04/25/2007 Document Revised: 03/06/2012 Document Reviewed: 06/26/2007 Exit[employer] Patient Information 2014 Exit[employer], [place].   ________________________________________________________________________    WHAT IS A BLOOD TRANSFUSION? Blood Transfusion Information  A transfusion is the replacement of blood or some of its parts. Blood is made up of multiple cells which provide different functions.  Red blood cells carry oxygen and are used for blood loss replacement.  White blood cells fight against infection.  Platelets control bleeding.  Plasma helps clot blood.  Other blood products are available for specialized needs, such as hemophilia or other clotting disorders. BEFORE THE TRANSFUSION  Who gives blood for transfusions?   Healthy volunteers who are fully evaluated to make sure their blood is safe. This is blood bank blood. Transfusion therapy is the safest it has ever been in the practice of medicine. Before blood is taken from a donor, a complete history is taken to make sure that person has no history of diseases nor engages in risky social behavior (examples are intravenous drug use or sexual  activity with multiple partners). The donor's travel history is screened to minimize risk of transmitting infections, such as malaria. The donated blood is tested for signs of infectious diseases, such as HIV and hepatitis. The blood is then tested to be sure it is compatible with you in order to minimize the chance of a transfusion reaction. If you or a relative donates blood, this is often done in anticipation of surgery and is not appropriate for emergency situations. It takes many days to process the donated blood. RISKS AND COMPLICATIONS Although transfusion therapy is very safe and saves many lives, the main dangers of transfusion include:   Getting an infectious disease.  Developing a transfusion reaction. This is an allergic reaction to something in the blood you were given. Every precaution is taken to prevent this. The decision to have a blood transfusion has been considered carefully by your caregiver before blood is given. Blood is not given unless the benefits outweigh the risks. AFTER THE TRANSFUSION  Right after receiving a blood transfusion, you will usually feel much better and more energetic. This is especially true if your red blood cells have gotten low (anemic). The transfusion raises the level of the red blood cells which carry oxygen, and this usually causes an energy increase.  The nurse administering the transfusion will monitor you carefully for complications. HOME CARE INSTRUCTIONS  No special instructions are needed after a transfusion. You may find your energy is better. Speak with your caregiver about any limitations on activity for underlying diseases you may have. [place]  CARE IF:   Your condition is not improving after your transfusion.  You develop redness or irritation at the intravenous (IV) site. SEEK IMMEDIATE MEDICAL CARE IF:  Any of the following symptoms occur over the next 12 hours:  Shaking chills.  You have a temperature by mouth above 102 F  (38.9 C), not controlled by medicine.  Chest, back, or muscle pain.  People around you feel you are not acting correctly or are confused.  Shortness of breath or difficulty breathing.  Dizziness and fainting.  You get a rash or develop hives.  You have a decrease in urine output.  Your urine turns a dark color or changes to pink, red, or brown. Any of the following symptoms occur over the next 10 days:  You have a temperature by mouth above 102 F (38.9 C), not controlled by medicine.  Shortness of breath.  Weakness after normal activity.  The white part of the eye turns yellow (jaundice).  You have a decrease in the amount of urine or are urinating less often.  Your urine turns a dark color or changes to pink, red, or brown. Document Released: 12/10/2000 Document Revised: 03/06/2012 Document Reviewed: 07/29/2008 [facility] Patient Information 2014 [place], [place].  _______________________________________________________________________

## 2018-03-17 NOTE — Progress Notes (Signed)
01-31-18 (Epic) CT abdomen

## 2018-03-20 ENCOUNTER — Other Ambulatory Visit: Payer: Self-pay

## 2018-03-20 ENCOUNTER — Encounter (HOSPITAL_COMMUNITY)
Admission: RE | Admit: 2018-03-20 | Discharge: 2018-03-20 | Disposition: A | Discharge status: Home / Self Care | Payer: 59 | Source: Ambulatory Visit | Attending: Surgery | Admitting: Surgery

## 2018-03-20 ENCOUNTER — Encounter (HOSPITAL_COMMUNITY): Payer: Self-pay

## 2018-03-20 DIAGNOSIS — Z01810 Encounter for preprocedural cardiovascular examination: Secondary | ICD-10-CM | POA: Insufficient documentation

## 2018-03-20 DIAGNOSIS — Z01812 Encounter for preprocedural laboratory examination: Secondary | ICD-10-CM | POA: Insufficient documentation

## 2018-03-20 DIAGNOSIS — E119 Type 2 diabetes mellitus without complications: Secondary | ICD-10-CM | POA: Insufficient documentation

## 2018-03-20 DIAGNOSIS — K388 Other specified diseases of appendix: Secondary | ICD-10-CM | POA: Insufficient documentation

## 2018-03-20 LAB — CBC
HCT: 43.5 % (ref 39.0–52.0)
Hemoglobin: 14.8 g/dL (ref 13.0–17.0)
MCH: 31.3 pg (ref 26.0–34.0)
MCHC: 34 g/dL (ref 30.0–36.0)
MCV: 92 fL (ref 78.0–100.0)
PLATELETS: 255 10*3/uL (ref 150–400)
RBC: 4.73 MIL/uL (ref 4.22–5.81)
RDW: 12.3 % (ref 11.5–15.5)
WBC: 7.9 10*3/uL (ref 4.0–10.5)

## 2018-03-20 LAB — BASIC METABOLIC PANEL
Anion gap: 9 (ref 5–15)
BUN: 16 mg/dL (ref 6–20)
CALCIUM: 9.6 mg/dL (ref 8.9–10.3)
CHLORIDE: 100 mmol/L — AB (ref 101–111)
CO2: 26 mmol/L (ref 22–32)
CREATININE: 0.89 mg/dL (ref 0.61–1.24)
GFR calc non Af Amer: >60 mL/min (ref >60)
Glucose, Bld: 83 mg/dL (ref 65–99)
Potassium: 4.3 mmol/L (ref 3.5–5.1)
SODIUM: 135 mmol/L (ref 135–145)

## 2018-03-20 LAB — GLUCOSE, CAPILLARY: Glucose-Capillary: 92 mg/dL (ref 65–99)

## 2018-03-20 LAB — ABO/RH: ABO/RH(D): O POS

## 2018-03-20 LAB — TYPE AND SCREEN
ABO/RH(D): O POS
Antibody Screen: NEGATIVE

## 2018-03-20 LAB — HEMOGLOBIN A1C
HEMOGLOBIN A1C: 5.3 % (ref 4.8–5.6)
MEAN PLASMA GLUCOSE: 105.41 mg/dL

## 2018-03-20 NOTE — Progress Notes (Signed)
Instruction regarding the Ensure drinks were discussed at PAT appointment. Pt will pick up the drinks at his scheduled education class on 03-24-18.

## 2018-03-30 ENCOUNTER — Encounter (HOSPITAL_COMMUNITY): Payer: Self-pay | Admitting: Anesthesiology

## 2018-03-30 MED ORDER — BUPIVACAINE LIPOSOME 1.3 % IJ SUSP
20.0000 mL | INTRAMUSCULAR | Status: DC
  Filled 2018-03-30: qty 20

## 2018-03-30 NOTE — Anesthesia Preprocedure Evaluation (Addendum)
Anesthesia Evaluation  Patient identified by MRN, date of birth, ID band Patient awake    Reviewed: Allergy & Precautions, NPO status , Patient's Chart, lab work & pertinent test results, reviewed documented beta blocker date and time   Airway Mallampati: III  TM Distance: >3 FB Neck ROM: Full    Dental  (+) Teeth Intact, Caps,    Pulmonary neg pulmonary ROS, former smoker,    Pulmonary exam normal breath sounds clear to auscultation       Cardiovascular negative cardio ROS Normal cardiovascular exam Rhythm:Regular Rate:Normal     Neuro/Psych negative neurological ROS  negative psych ROS   GI/Hepatic (+) Hepatitis -, CTreated as per patient Mucocele of appendix   Endo/Other  diabetes, Well Controlled, Type 2, Oral Hypoglycemic Agents  Renal/GU negative Renal ROS  negative genitourinary   Musculoskeletal negative musculoskeletal ROS (+)   Abdominal   Peds  Hematology negative hematology ROS (+)   Anesthesia Other Findings   Reproductive/Obstetrics                            Anesthesia Physical Anesthesia Plan  ASA: II  Anesthesia Plan: General   Post-op Pain Management:    Induction: Intravenous  PONV Risk Score and Plan: 4 or greater and Ondansetron, Dexamethasone, Treatment may vary due to age or medical condition and Midazolam  Airway Management Planned: Oral ETT  Additional Equipment:   Intra-op Plan:   Post-operative Plan: Extubation in OR  Informed Consent: I have reviewed the patients History and Physical, chart, labs and discussed the procedure including the risks, benefits and alternatives for the proposed anesthesia with the patient or authorized representative who has indicated his/her understanding and acceptance.   Dental advisory given  Plan Discussed with: CRNA, Anesthesiologist and Surgeon  Anesthesia Plan Comments:        Anesthesia Quick  Evaluation

## 2018-03-31 ENCOUNTER — Inpatient Hospital Stay (HOSPITAL_COMMUNITY)
Admission: RE | Admit: 2018-03-31 | Discharge: 2018-04-03 | DRG: 330 | Disposition: A | Discharge status: Home / Self Care | Payer: 59 | Attending: Surgery | Admitting: Surgery

## 2018-03-31 ENCOUNTER — Encounter (HOSPITAL_COMMUNITY): Payer: Self-pay | Admitting: *Deleted

## 2018-03-31 ENCOUNTER — Encounter (HOSPITAL_COMMUNITY)
Admission: RE | Disposition: A | Discharge status: Home / Self Care | Payer: Self-pay | Source: Home / Self Care | Attending: Surgery

## 2018-03-31 ENCOUNTER — Inpatient Hospital Stay (HOSPITAL_COMMUNITY): Payer: 59 | Admitting: Anesthesiology

## 2018-03-31 ENCOUNTER — Other Ambulatory Visit: Payer: Self-pay

## 2018-03-31 DIAGNOSIS — Z7984 Long term (current) use of oral hypoglycemic drugs: Secondary | ICD-10-CM | POA: Diagnosis not present

## 2018-03-31 DIAGNOSIS — E039 Hypothyroidism, unspecified: Secondary | ICD-10-CM | POA: Diagnosis present

## 2018-03-31 DIAGNOSIS — Z86010 Personal history of colonic polyps: Secondary | ICD-10-CM | POA: Diagnosis not present

## 2018-03-31 DIAGNOSIS — F411 Generalized anxiety disorder: Secondary | ICD-10-CM

## 2018-03-31 DIAGNOSIS — Z79899 Other long term (current) drug therapy: Secondary | ICD-10-CM

## 2018-03-31 DIAGNOSIS — Z791 Long term (current) use of non-steroidal anti-inflammatories (NSAID): Secondary | ICD-10-CM

## 2018-03-31 DIAGNOSIS — Z87891 Personal history of nicotine dependence: Secondary | ICD-10-CM | POA: Diagnosis not present

## 2018-03-31 DIAGNOSIS — K388 Other specified diseases of appendix: Principal | ICD-10-CM | POA: Diagnosis present

## 2018-03-31 DIAGNOSIS — Z833 Family history of diabetes mellitus: Secondary | ICD-10-CM

## 2018-03-31 DIAGNOSIS — Q438 Other specified congenital malformations of intestine: Secondary | ICD-10-CM | POA: Diagnosis not present

## 2018-03-31 DIAGNOSIS — F329 Major depressive disorder, single episode, unspecified: Secondary | ICD-10-CM | POA: Diagnosis present

## 2018-03-31 DIAGNOSIS — Z818 Family history of other mental and behavioral disorders: Secondary | ICD-10-CM

## 2018-03-31 DIAGNOSIS — G4700 Insomnia, unspecified: Secondary | ICD-10-CM | POA: Diagnosis present

## 2018-03-31 DIAGNOSIS — I10 Essential (primary) hypertension: Secondary | ICD-10-CM | POA: Diagnosis present

## 2018-03-31 DIAGNOSIS — B182 Chronic viral hepatitis C: Secondary | ICD-10-CM

## 2018-03-31 DIAGNOSIS — E119 Type 2 diabetes mellitus without complications: Secondary | ICD-10-CM | POA: Diagnosis present

## 2018-03-31 DIAGNOSIS — F32A Depression, unspecified: Secondary | ICD-10-CM

## 2018-03-31 HISTORY — DX: Hypothyroidism, unspecified: E03.9

## 2018-03-31 HISTORY — DX: Chronic viral hepatitis C: B18.2

## 2018-03-31 HISTORY — DX: Other specified diseases of appendix: K38.8

## 2018-03-31 HISTORY — DX: Insomnia, unspecified: G47.00

## 2018-03-31 HISTORY — DX: Major depressive disorder, single episode, unspecified: F32.9

## 2018-03-31 HISTORY — DX: Essential (primary) hypertension: I10

## 2018-03-31 LAB — GLUCOSE, CAPILLARY
GLUCOSE-CAPILLARY: 166 mg/dL — AB (ref 65–99)
GLUCOSE-CAPILLARY: 232 mg/dL — AB (ref 65–99)
Glucose-Capillary: 133 mg/dL — ABNORMAL HIGH (ref 65–99)
Glucose-Capillary: 140 mg/dL — ABNORMAL HIGH (ref 65–99)

## 2018-03-31 SURGERY — COLECTOMY, PARTIAL, ROBOT-ASSISTED, LAPAROSCOPIC
Anesthesia: General | Site: Abdomen

## 2018-03-31 MED ORDER — METOCLOPRAMIDE HCL 5 MG/ML IJ SOLN
10.0000 mg | Freq: Four times a day (QID) | INTRAMUSCULAR | Status: DC | PRN
  Administered 2018-03-31: 10 mg via INTRAVENOUS
  Filled 2018-03-31: qty 2

## 2018-03-31 MED ORDER — BUPIVACAINE LIPOSOME 1.3 % IJ SUSP
INTRAMUSCULAR | Status: DC | PRN
  Administered 2018-03-31: 20 mL

## 2018-03-31 MED ORDER — LIDOCAINE 2% (20 MG/ML) 5 ML SYRINGE
INTRAMUSCULAR | Status: DC | PRN
  Administered 2018-03-31: 1.5 mg/kg/h via INTRAVENOUS

## 2018-03-31 MED ORDER — EPHEDRINE 5 MG/ML INJ
INTRAVENOUS | Status: AC
  Filled 2018-03-31: qty 10

## 2018-03-31 MED ORDER — LACTATED RINGERS IR SOLN
Status: DC | PRN
  Administered 2018-03-31: 1000 mL

## 2018-03-31 MED ORDER — ALUM & MAG HYDROXIDE-SIMETH 200-200-20 MG/5ML PO SUSP: 30.0000 mL | Freq: Four times a day (QID) | ORAL | Status: DC | PRN

## 2018-03-31 MED ORDER — BUPIVACAINE HCL (PF) 0.25 % IJ SOLN
INTRAMUSCULAR | Status: AC
  Filled 2018-03-31: qty 60

## 2018-03-31 MED ORDER — PROPOFOL 10 MG/ML IV BOLUS
INTRAVENOUS | Status: AC
  Filled 2018-03-31: qty 20

## 2018-03-31 MED ORDER — ROCURONIUM BROMIDE 10 MG/ML (PF) SYRINGE
PREFILLED_SYRINGE | INTRAVENOUS | Status: AC
  Filled 2018-03-31: qty 5

## 2018-03-31 MED ORDER — MEPERIDINE HCL 50 MG/ML IJ SOLN: 6.2500 mg | INTRAMUSCULAR | Status: DC | PRN

## 2018-03-31 MED ORDER — ENSURE SURGERY PO LIQD
237.0000 mL | Freq: Two times a day (BID) | ORAL | Status: DC
  Administered 2018-03-31 – 2018-04-03 (×6): 237 mL via ORAL
  Filled 2018-03-31 (×7): qty 237

## 2018-03-31 MED ORDER — PROCHLORPERAZINE EDISYLATE 5 MG/ML IJ SOLN
5.0000 mg | Freq: Four times a day (QID) | INTRAMUSCULAR | Status: DC | PRN
  Filled 2018-03-31: qty 2

## 2018-03-31 MED ORDER — SODIUM CHLORIDE 0.9 % IV SOLN
INTRAVENOUS | Status: DC
  Filled 2018-03-31: qty 6

## 2018-03-31 MED ORDER — CELECOXIB 200 MG PO CAPS
200.0000 mg | ORAL_CAPSULE | ORAL | Status: AC
  Administered 2018-03-31: 200 mg via ORAL
  Filled 2018-03-31: qty 1

## 2018-03-31 MED ORDER — PHENYLEPHRINE 40 MCG/ML (10ML) SYRINGE FOR IV PUSH (FOR BLOOD PRESSURE SUPPORT)
PREFILLED_SYRINGE | INTRAVENOUS | Status: DC | PRN
  Administered 2018-03-31: 120 ug via INTRAVENOUS
  Administered 2018-03-31 (×2): 80 ug via INTRAVENOUS
  Administered 2018-03-31: 120 ug via INTRAVENOUS

## 2018-03-31 MED ORDER — CEFOTETAN DISODIUM-DEXTROSE 2-2.08 GM-%(50ML) IV SOLR
2.0000 g | INTRAVENOUS | Status: AC
  Administered 2018-03-31: 2 g via INTRAVENOUS
  Filled 2018-03-31: qty 50

## 2018-03-31 MED ORDER — MIDAZOLAM HCL 2 MG/2ML IJ SOLN
INTRAMUSCULAR | Status: AC
  Filled 2018-03-31: qty 2

## 2018-03-31 MED ORDER — LIP MEDEX EX OINT
1.0000 "application " | TOPICAL_OINTMENT | Freq: Two times a day (BID) | CUTANEOUS | Status: DC
  Administered 2018-03-31 – 2018-04-03 (×7): 1 via TOPICAL
  Filled 2018-03-31 (×2): qty 7

## 2018-03-31 MED ORDER — ENOXAPARIN SODIUM 40 MG/0.4ML ~~LOC~~ SOLN
40.0000 mg | SUBCUTANEOUS | Status: DC
  Administered 2018-04-01 – 2018-04-03 (×3): 40 mg via SUBCUTANEOUS
  Filled 2018-03-31 (×3): qty 0.4

## 2018-03-31 MED ORDER — ACETAMINOPHEN 500 MG PO TABS
1000.0000 mg | ORAL_TABLET | Freq: Four times a day (QID) | ORAL | Status: DC
  Administered 2018-03-31 – 2018-04-03 (×10): 1000 mg via ORAL
  Filled 2018-03-31 (×9): qty 2

## 2018-03-31 MED ORDER — DIPHENHYDRAMINE HCL 12.5 MG/5ML PO ELIX: 12.5000 mg | ORAL_SOLUTION | Freq: Four times a day (QID) | ORAL | Status: DC | PRN

## 2018-03-31 MED ORDER — GUAIFENESIN-DM 100-10 MG/5ML PO SYRP: 10.0000 mL | ORAL_SOLUTION | ORAL | Status: DC | PRN

## 2018-03-31 MED ORDER — CEFOTETAN DISODIUM-DEXTROSE 2-2.08 GM-%(50ML) IV SOLR
2.0000 g | Freq: Two times a day (BID) | INTRAVENOUS | Status: AC
  Administered 2018-03-31: 2 g via INTRAVENOUS
  Filled 2018-03-31: qty 50

## 2018-03-31 MED ORDER — TRAMADOL HCL 50 MG PO TABS: 50.0000 mg | ORAL_TABLET | Freq: Four times a day (QID) | ORAL | 0 refills | Status: AC | PRN

## 2018-03-31 MED ORDER — MIDAZOLAM HCL 2 MG/2ML IJ SOLN
INTRAMUSCULAR | Status: DC | PRN
  Administered 2018-03-31: 2 mg via INTRAVENOUS

## 2018-03-31 MED ORDER — SODIUM CHLORIDE 0.9 % IV SOLN
2.0000 g | Freq: Two times a day (BID) | INTRAVENOUS | Status: DC
  Filled 2018-03-31: qty 2

## 2018-03-31 MED ORDER — LACTATED RINGERS IV SOLN
INTRAVENOUS | Status: DC
  Administered 2018-03-31 (×2): via INTRAVENOUS

## 2018-03-31 MED ORDER — ALVIMOPAN 12 MG PO CAPS: 12.0000 mg | ORAL_CAPSULE | Freq: Two times a day (BID) | ORAL | Status: DC

## 2018-03-31 MED ORDER — SUGAMMADEX SODIUM 200 MG/2ML IV SOLN
INTRAVENOUS | Status: DC | PRN
  Administered 2018-03-31: 200 mg via INTRAVENOUS

## 2018-03-31 MED ORDER — HYDRALAZINE HCL 20 MG/ML IJ SOLN: 10.0000 mg | INTRAMUSCULAR | Status: DC | PRN

## 2018-03-31 MED ORDER — ENOXAPARIN SODIUM 40 MG/0.4ML ~~LOC~~ SOLN
40.0000 mg | Freq: Once | SUBCUTANEOUS | Status: AC
  Administered 2018-03-31: 40 mg via SUBCUTANEOUS
  Filled 2018-03-31: qty 0.4

## 2018-03-31 MED ORDER — GABAPENTIN 300 MG PO CAPS
300.0000 mg | ORAL_CAPSULE | Freq: Two times a day (BID) | ORAL | Status: DC
  Administered 2018-03-31 – 2018-04-03 (×7): 300 mg via ORAL
  Filled 2018-03-31 (×7): qty 1

## 2018-03-31 MED ORDER — LIDOCAINE 2% (20 MG/ML) 5 ML SYRINGE
INTRAMUSCULAR | Status: AC
  Filled 2018-03-31: qty 5

## 2018-03-31 MED ORDER — PROMETHAZINE HCL 25 MG/ML IJ SOLN: 6.2500 mg | INTRAMUSCULAR | Status: DC | PRN

## 2018-03-31 MED ORDER — HYDROCORTISONE 1 % EX CREA
1.0000 "application " | TOPICAL_CREAM | Freq: Three times a day (TID) | CUTANEOUS | Status: DC | PRN
  Filled 2018-03-31: qty 28

## 2018-03-31 MED ORDER — SACCHAROMYCES BOULARDII 250 MG PO CAPS
250.0000 mg | ORAL_CAPSULE | Freq: Two times a day (BID) | ORAL | Status: DC
  Administered 2018-03-31 – 2018-04-03 (×7): 250 mg via ORAL
  Filled 2018-03-31 (×7): qty 1

## 2018-03-31 MED ORDER — HYDROCODONE-ACETAMINOPHEN 7.5-325 MG PO TABS: 1.0000 | ORAL_TABLET | Freq: Once | ORAL | Status: DC | PRN

## 2018-03-31 MED ORDER — ALVIMOPAN 12 MG PO CAPS
12.0000 mg | ORAL_CAPSULE | ORAL | Status: AC
  Administered 2018-03-31: 12 mg via ORAL
  Filled 2018-03-31: qty 1

## 2018-03-31 MED ORDER — PROPOFOL 10 MG/ML IV BOLUS
INTRAVENOUS | Status: DC | PRN
  Administered 2018-03-31: 170 mg via INTRAVENOUS

## 2018-03-31 MED ORDER — ONDANSETRON HCL 4 MG/2ML IJ SOLN
INTRAMUSCULAR | Status: AC
  Filled 2018-03-31: qty 2

## 2018-03-31 MED ORDER — MAGIC MOUTHWASH
15.0000 mL | Freq: Four times a day (QID) | ORAL | Status: DC | PRN
  Filled 2018-03-31: qty 15

## 2018-03-31 MED ORDER — KETAMINE HCL 10 MG/ML IJ SOLN
INTRAMUSCULAR | Status: DC | PRN
  Administered 2018-03-31: 45 mg via INTRAVENOUS

## 2018-03-31 MED ORDER — FENTANYL CITRATE (PF) 250 MCG/5ML IJ SOLN
INTRAMUSCULAR | Status: DC | PRN
  Administered 2018-03-31: 150 ug via INTRAVENOUS
  Administered 2018-03-31 (×2): 50 ug via INTRAVENOUS

## 2018-03-31 MED ORDER — PROCHLORPERAZINE MALEATE 10 MG PO TABS: 10.0000 mg | ORAL_TABLET | Freq: Four times a day (QID) | ORAL | Status: DC | PRN

## 2018-03-31 MED ORDER — KETAMINE HCL 10 MG/ML IJ SOLN
INTRAMUSCULAR | Status: AC
  Filled 2018-03-31: qty 1

## 2018-03-31 MED ORDER — METOCLOPRAMIDE HCL 5 MG/ML IJ SOLN: 10.0000 mg | Freq: Four times a day (QID) | INTRAMUSCULAR | Status: DC | PRN

## 2018-03-31 MED ORDER — DEXAMETHASONE SODIUM PHOSPHATE 10 MG/ML IJ SOLN
INTRAMUSCULAR | Status: DC | PRN
  Administered 2018-03-31: 10 mg via INTRAVENOUS

## 2018-03-31 MED ORDER — EPHEDRINE SULFATE-NACL 50-0.9 MG/10ML-% IV SOSY
PREFILLED_SYRINGE | INTRAVENOUS | Status: DC | PRN
  Administered 2018-03-31: 10 mg via INTRAVENOUS
  Administered 2018-03-31: 5 mg via INTRAVENOUS
  Administered 2018-03-31 (×2): 10 mg via INTRAVENOUS

## 2018-03-31 MED ORDER — NEOMYCIN SULFATE 500 MG PO TABS
1000.0000 mg | ORAL_TABLET | ORAL | Status: DC
  Filled 2018-03-31: qty 2

## 2018-03-31 MED ORDER — METOPROLOL TARTRATE 5 MG/5ML IV SOLN: 5.0000 mg | Freq: Four times a day (QID) | INTRAVENOUS | Status: DC | PRN

## 2018-03-31 MED ORDER — HYDROMORPHONE HCL 1 MG/ML IJ SOLN: 0.5000 mg | INTRAMUSCULAR | Status: DC | PRN

## 2018-03-31 MED ORDER — ACETAMINOPHEN 500 MG PO TABS
1000.0000 mg | ORAL_TABLET | ORAL | Status: AC
  Administered 2018-03-31: 1000 mg via ORAL
  Filled 2018-03-31: qty 2

## 2018-03-31 MED ORDER — BUPIVACAINE HCL (PF) 0.25 % IJ SOLN
INTRAMUSCULAR | Status: DC | PRN
  Administered 2018-03-31: 60 mL

## 2018-03-31 MED ORDER — ROCURONIUM BROMIDE 10 MG/ML (PF) SYRINGE
PREFILLED_SYRINGE | INTRAVENOUS | Status: DC | PRN
  Administered 2018-03-31: 20 mg via INTRAVENOUS
  Administered 2018-03-31: 50 mg via INTRAVENOUS
  Administered 2018-03-31: 10 mg via INTRAVENOUS
  Administered 2018-03-31: 20 mg via INTRAVENOUS
  Administered 2018-03-31 (×3): 10 mg via INTRAVENOUS

## 2018-03-31 MED ORDER — GABAPENTIN 300 MG PO CAPS
300.0000 mg | ORAL_CAPSULE | ORAL | Status: AC
  Administered 2018-03-31: 300 mg via ORAL
  Filled 2018-03-31: qty 1

## 2018-03-31 MED ORDER — DIPHENHYDRAMINE HCL 50 MG/ML IJ SOLN: 12.5000 mg | Freq: Four times a day (QID) | INTRAMUSCULAR | Status: DC | PRN

## 2018-03-31 MED ORDER — ONDANSETRON HCL 4 MG PO TABS: 4.0000 mg | ORAL_TABLET | Freq: Four times a day (QID) | ORAL | Status: DC | PRN

## 2018-03-31 MED ORDER — LACTATED RINGERS IV BOLUS: 1000.0000 mL | Freq: Three times a day (TID) | INTRAVENOUS | Status: AC | PRN

## 2018-03-31 MED ORDER — BISACODYL 5 MG PO TBEC
20.0000 mg | DELAYED_RELEASE_TABLET | Freq: Once | ORAL | Status: DC
  Filled 2018-03-31: qty 4

## 2018-03-31 MED ORDER — ONDANSETRON HCL 4 MG/2ML IJ SOLN
4.0000 mg | Freq: Four times a day (QID) | INTRAMUSCULAR | Status: DC | PRN
  Administered 2018-03-31: 4 mg via INTRAVENOUS
  Filled 2018-03-31: qty 2

## 2018-03-31 MED ORDER — HYDROMORPHONE HCL 1 MG/ML IJ SOLN: 0.2500 mg | INTRAMUSCULAR | Status: DC | PRN

## 2018-03-31 MED ORDER — LIDOCAINE 2% (20 MG/ML) 5 ML SYRINGE
INTRAMUSCULAR | Status: DC | PRN
  Administered 2018-03-31: 100 mg via INTRAVENOUS

## 2018-03-31 MED ORDER — PHENOL 1.4 % MT LIQD
1.0000 | OROMUCOSAL | Status: DC | PRN
  Filled 2018-03-31: qty 177

## 2018-03-31 MED ORDER — BUPIVACAINE-EPINEPHRINE (PF) 0.5% -1:200000 IJ SOLN
INTRAMUSCULAR | Status: AC
  Filled 2018-03-31: qty 60

## 2018-03-31 MED ORDER — DEXAMETHASONE SODIUM PHOSPHATE 10 MG/ML IJ SOLN
INTRAMUSCULAR | Status: AC
  Filled 2018-03-31: qty 1

## 2018-03-31 MED ORDER — METRONIDAZOLE 500 MG PO TABS
1000.0000 mg | ORAL_TABLET | ORAL | Status: DC
  Filled 2018-03-31: qty 2

## 2018-03-31 MED ORDER — SODIUM CHLORIDE 0.9 % IV SOLN
INTRAVENOUS | Status: DC | PRN
  Administered 2018-03-31: 10:00:00 via INTRAPERITONEAL

## 2018-03-31 MED ORDER — HYDROCORTISONE 2.5 % RE CREA
1.0000 "application " | TOPICAL_CREAM | Freq: Four times a day (QID) | RECTAL | Status: DC | PRN
  Filled 2018-03-31: qty 28.35

## 2018-03-31 MED ORDER — POLYETHYLENE GLYCOL 3350 17 GM/SCOOP PO POWD: 1.0000 | Freq: Once | ORAL | Status: DC

## 2018-03-31 MED ORDER — LIDOCAINE HCL 2 % IJ SOLN
INTRAMUSCULAR | Status: AC
  Filled 2018-03-31: qty 20

## 2018-03-31 MED ORDER — MENTHOL 3 MG MT LOZG: 1.0000 | LOZENGE | OROMUCOSAL | Status: DC | PRN

## 2018-03-31 MED ORDER — PHENYLEPHRINE 40 MCG/ML (10ML) SYRINGE FOR IV PUSH (FOR BLOOD PRESSURE SUPPORT)
PREFILLED_SYRINGE | INTRAVENOUS | Status: AC
  Filled 2018-03-31: qty 10

## 2018-03-31 MED ORDER — LACTATED RINGERS IV SOLN
INTRAVENOUS | Status: AC
  Administered 2018-03-31: 15:00:00 via INTRAVENOUS

## 2018-03-31 MED ORDER — 0.9 % SODIUM CHLORIDE (POUR BTL) OPTIME
TOPICAL | Status: DC | PRN
  Administered 2018-03-31: 2000 mL

## 2018-03-31 MED ORDER — ONDANSETRON HCL 4 MG/2ML IJ SOLN
INTRAMUSCULAR | Status: DC | PRN
  Administered 2018-03-31: 4 mg via INTRAVENOUS

## 2018-03-31 MED ORDER — SUGAMMADEX SODIUM 200 MG/2ML IV SOLN
INTRAVENOUS | Status: AC
  Filled 2018-03-31: qty 2

## 2018-03-31 MED ORDER — FENTANYL CITRATE (PF) 250 MCG/5ML IJ SOLN
INTRAMUSCULAR | Status: AC
  Filled 2018-03-31: qty 5

## 2018-03-31 MED ORDER — TRAMADOL HCL 50 MG PO TABS
50.0000 mg | ORAL_TABLET | Freq: Four times a day (QID) | ORAL | Status: DC | PRN
  Administered 2018-03-31 – 2018-04-01 (×2): 50 mg via ORAL
  Filled 2018-03-31 (×2): qty 1

## 2018-03-31 SURGICAL SUPPLY — 98 items
APPLIER CLIP 5 13 M/L LIGAMAX5 (MISCELLANEOUS)
APPLIER CLIP ROT 10 11.4 M/L (STAPLE)
APR CLP MED LRG 11.4X10 (STAPLE)
APR CLP MED LRG 5 ANG JAW (MISCELLANEOUS)
BLADE EXTENDED COATED 6.5IN (ELECTRODE) ×2 IMPLANT
CANNULA REDUC XI 12-8 STAPL (CANNULA) ×1
CANNULA REDUCER 12-8 DVNC XI (CANNULA) ×1 IMPLANT
CELLS DAT CNTRL 66122 CELL SVR (MISCELLANEOUS) IMPLANT
CHLORAPREP W/TINT 26ML (MISCELLANEOUS) ×2 IMPLANT
CLIP APPLIE 5 13 M/L LIGAMAX5 (MISCELLANEOUS) IMPLANT
CLIP APPLIE ROT 10 11.4 M/L (STAPLE) IMPLANT
CLIP VESOLOCK LG 6/CT PURPLE (CLIP) IMPLANT
CLIP VESOLOCK MED LG 6/CT (CLIP) IMPLANT
COVER SURGICAL LIGHT HANDLE (MISCELLANEOUS) ×2 IMPLANT
COVER TIP SHEARS 8 DVNC (MISCELLANEOUS) ×1 IMPLANT
COVER TIP SHEARS 8MM DA VINCI (MISCELLANEOUS) ×1
DEVICE TROCAR PUNCTURE CLOSURE (ENDOMECHANICALS) IMPLANT
DRAIN CHANNEL 19F RND (DRAIN) ×1 IMPLANT
DRAPE ARM DVNC X/XI (DISPOSABLE) ×3 IMPLANT
DRAPE COLUMN DVNC XI (DISPOSABLE) ×1 IMPLANT
DRAPE DA VINCI XI ARM (DISPOSABLE) ×4
DRAPE DA VINCI XI COLUMN (DISPOSABLE) ×1
DRAPE SURG IRRIG POUCH 19X23 (DRAPES) ×2 IMPLANT
DRSG OPSITE POSTOP 4X6 (GAUZE/BANDAGES/DRESSINGS) ×1 IMPLANT
DRSG TEGADERM 2-3/8X2-3/4 SM (GAUZE/BANDAGES/DRESSINGS) ×5 IMPLANT
ELECT PENCIL ROCKER SW 15FT (MISCELLANEOUS) ×2 IMPLANT
ELECT REM PT RETURN 15FT ADLT (MISCELLANEOUS) ×2 IMPLANT
ENDOLOOP SUT PDS II  0 18 (SUTURE)
ENDOLOOP SUT PDS II 0 18 (SUTURE) IMPLANT
EVACUATOR SILICONE 100CC (DRAIN) ×2 IMPLANT
GAUZE SPONGE 4X4 12PLY STRL (GAUZE/BANDAGES/DRESSINGS) ×1 IMPLANT
GLOVE ECLIPSE 8.0 STRL XLNG CF (GLOVE) ×10 IMPLANT
GLOVE INDICATOR 8.0 STRL GRN (GLOVE) ×10 IMPLANT
GOWN STRL REUS W/TWL XL LVL3 (GOWN DISPOSABLE) ×10 IMPLANT
GRASPER ENDOPATH ANVIL 10MM (MISCELLANEOUS) IMPLANT
GRASPER SUT TROCAR 14GX15 (MISCELLANEOUS) ×1 IMPLANT
HOLDER FOLEY CATH W/STRAP (MISCELLANEOUS) ×2 IMPLANT
IRRIG SUCT STRYKERFLOW 2 WTIP (MISCELLANEOUS)
IRRIGATION SUCT STRKRFLW 2 WTP (MISCELLANEOUS) IMPLANT
IRRIGATOR SUCT 8 DISP DVNC XI (IRRIGATION / IRRIGATOR) IMPLANT
IRRIGATOR SUCTION 8MM XI DISP (IRRIGATION / IRRIGATOR)
KIT PROCEDURE DA VINCI SI (MISCELLANEOUS) ×1
KIT PROCEDURE DVNC SI (MISCELLANEOUS) ×1 IMPLANT
LUBRICANT JELLY K Y 4OZ (MISCELLANEOUS) ×1 IMPLANT
NDL INSUFFLATION 14GA 120MM (NEEDLE) ×1 IMPLANT
NEEDLE INSUFFLATION 14GA 120MM (NEEDLE) ×2 IMPLANT
PACK CARDIOVASCULAR III (CUSTOM PROCEDURE TRAY) ×3 IMPLANT
PACK COLON (CUSTOM PROCEDURE TRAY) ×2 IMPLANT
PAD POSITIONING PINK XL (MISCELLANEOUS) ×2 IMPLANT
PORT LAP GEL ALEXIS MED 5-9CM (MISCELLANEOUS) ×2 IMPLANT
RELOAD STAPLE 45 BLU REG DVNC (STAPLE) IMPLANT
RELOAD STAPLE 45 GRN THCK DVNC (STAPLE) IMPLANT
RETRACTOR WND ALEXIS 18 MED (MISCELLANEOUS) IMPLANT
RTRCTR WOUND ALEXIS 18CM MED (MISCELLANEOUS)
SCISSORS LAP 5X35 DISP (ENDOMECHANICALS) ×2 IMPLANT
SEAL CANN UNIV 5-8 DVNC XI (MISCELLANEOUS) ×3 IMPLANT
SEAL XI 5MM-8MM UNIVERSAL (MISCELLANEOUS) ×3
SEALER VESSEL DA VINCI XI (MISCELLANEOUS) ×1
SEALER VESSEL EXT DVNC XI (MISCELLANEOUS) ×1 IMPLANT
SLEEVE ADV FIXATION 5X100MM (TROCAR) ×2 IMPLANT
SOLUTION ELECTROLUBE (MISCELLANEOUS) ×2 IMPLANT
STAPLER 45 BLU RELOAD XI (STAPLE) ×5 IMPLANT
STAPLER 45 BLUE RELOAD XI (STAPLE) ×5
STAPLER 45 GREEN RELOAD XI (STAPLE)
STAPLER 45 GRN RELOAD XI (STAPLE) IMPLANT
STAPLER CANNULA SEAL DVNC XI (STAPLE) ×1 IMPLANT
STAPLER CANNULA SEAL XI (STAPLE) ×1
STAPLER SHEATH (SHEATH) ×1
STAPLER SHEATH ENDOWRIST DVNC (SHEATH) ×1 IMPLANT
SUT MNCRL AB 4-0 PS2 18 (SUTURE) ×2 IMPLANT
SUT PDS AB 1 CT1 27 (SUTURE) ×2 IMPLANT
SUT PDS AB 1 CTX 36 (SUTURE) IMPLANT
SUT PDS AB 1 TP1 96 (SUTURE) IMPLANT
SUT PDS AB 2-0 CT2 27 (SUTURE) IMPLANT
SUT PROLENE 0 CT 2 (SUTURE) ×1 IMPLANT
SUT PROLENE 2 0 KS (SUTURE) IMPLANT
SUT PROLENE 2 0 SH DA (SUTURE) IMPLANT
SUT SILK 2 0 (SUTURE) ×2
SUT SILK 2 0 SH CR/8 (SUTURE) ×2 IMPLANT
SUT SILK 2-0 18XBRD TIE 12 (SUTURE) ×1 IMPLANT
SUT SILK 3 0 (SUTURE) ×2
SUT SILK 3 0 SH CR/8 (SUTURE) ×2 IMPLANT
SUT SILK 3-0 18XBRD TIE 12 (SUTURE) ×1 IMPLANT
SUT V-LOC BARB 180 2/0GR6 GS22 (SUTURE) ×4
SUT VIC AB 3-0 SH 18 (SUTURE) ×1 IMPLANT
SUT VIC AB 3-0 SH 27 (SUTURE)
SUT VIC AB 3-0 SH 27XBRD (SUTURE) ×1 IMPLANT
SUT VICRYL 0 UR6 27IN ABS (SUTURE) ×2 IMPLANT
SUTURE V-LC BRB 180 2/0GR6GS22 (SUTURE) IMPLANT
SYR 10ML LL (SYRINGE) ×2 IMPLANT
SYS LAPSCP GELPORT 120MM (MISCELLANEOUS)
SYSTEM LAPSCP GELPORT 120MM (MISCELLANEOUS) IMPLANT
TAPE UMBILICAL COTTON 1/8X30 (MISCELLANEOUS) ×2 IMPLANT
TOWEL OR NON WOVEN STRL DISP B (DISPOSABLE) ×2 IMPLANT
TRAY FOLEY W/METER SILVER 16FR (SET/KITS/TRAYS/PACK) ×2 IMPLANT
TROCAR ADV FIXATION 5X100MM (TROCAR) ×2 IMPLANT
TUBING CONNECTING 10 (TUBING) ×4 IMPLANT
TUBING INSUFFLATION 10FT LAP (TUBING) ×2 IMPLANT

## 2018-03-31 NOTE — Interval H&P Note (Signed)
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 After consideration of risks, benefits and other options for treatment, the patient has consented to  Procedure(s): XI [place] (N/A) as a surgical intervention .  The patient's history has been reviewed, patient examined, no change in status, stable for surgery.  I have reviewed the patient's chart and labs.  Questions were answered to the patient's satisfaction.     Adin Hector

## 2018-03-31 NOTE — Anesthesia Postprocedure Evaluation (Signed)
Anesthesia Post Note  Patient: [name] [name]  Procedure(s) Performed: XI ROBOT PROXIMAL RIGHT COLECTOMY (N/A Abdomen)     Patient location during evaluation: PACU Anesthesia Type: General Level of consciousness: awake and alert Pain management: pain level controlled Vital Signs Assessment: post-procedure vital signs reviewed and stable Respiratory status: spontaneous breathing, nonlabored ventilation and respiratory function stable Cardiovascular status: blood pressure returned to baseline and stable Postop Assessment: no apparent nausea or vomiting Anesthetic complications: no    Last Vitals:  Vitals:   03/31/18 1115 03/31/18 1130  BP: (!) 147/85 133/80  Pulse: 97 94  Resp: 13 15  Temp:    SpO2: 99% 100%    Last Pain:  Vitals:   03/31/18 1130  TempSrc:   PainSc: Asleep                 Raygen Dahm A.

## 2018-03-31 NOTE — Discharge Instructions (Signed)
ANORECTAL SURGERY:  POST OPERATIVE INSTRUCTIONS  ######################################################################  EAT Start with a pureed / full liquid diet After 24 hours, gradually transition to a high fiber diet.    CONTROL PAIN Control pain so you can tolerate bowel movements,  walk, sleep, tolerate sneezing/coughing, and go up/down stairs.   HAVE A BOWEL MOVEMENT DAILY Keep your bowels regular to avoid problems.   Taking a fiber supplement every day to keep bowels soft.   Try a laxative to override constipation. Use an antidairrheal to slow down diarrhea.   Call if not better after 2 tries  WALK Walk an hour a day.  Control your pain to do that.   CALL IF YOU HAVE PROBLEMS/CONCERNS Call if you are still struggling despite following these instructions. Call if you have concerns not answered by these instructions  ######################################################################    1. Take your usually prescribed home medications unless otherwise directed. 2. DIET: Follow a light bland diet the first 24 hours after arrival home, such as soup, liquids, crackers, etc.  Be sure to include lots of fluids daily.  Avoid fast food or heavy meals as your are more likely to get nauseated.  Eat a low fat the next few days after surgery.   3. PAIN CONTROL: a. Pain is best controlled by a usual combination of three different methods TOGETHER: i. Ice/Heat ii. Over the counter pain medication iii. Prescription pain medication b. Expect swelling and discomfort in the anus/rectal area.  Warm water baths (30-60 minutes up to 6 times a day, especially after bowel meovements) will help. Use ice for the first few days to help decrease swelling and bruising, then switch to heat such as warm towels, sitz baths, warm baths, etc to help relax tight/sore spots and speed recovery.  Some people prefer to use ice alone, heat alone, alternating between ice & heat.  Experiment to what works  for you.   c. It is helpful to take an over-the-counter pain medication regularly for the first few weeks.  Choose one of the following that works best for you: i. Naproxen (Aleve, etc)  Two 220mg  tabs twice a day ii. Ibuprofen (Advil, etc) Three 200mg  tabs four times a day (every meal & bedtime) iii. Acetaminophen (Tylenol, etc) 500-650mg  four times a day (every meal & bedtime) d. A  prescription for pain medication (such as oxycodone, hydrocodone, etc) should be given to you upon discharge.  Take your pain medication as prescribed.  i. If you are having problems/concerns with the prescription medicine (does not control pain, nausea, vomiting, rash, itching, etc), please call us [phone] to see if we need to switch you to a different pain medicine that will work better for you and/or control your side effect better. ii. If you need a refill on your pain medication, please contact your pharmacy.  They will contact our office to request authorization. Prescriptions will not be filled after 5 pm or on week-ends.  Use a Sitz Bath 4-8 times a day for relief   [employer] A sitz bath is a warm water bath taken in the sitting position that covers only the hips and buttocks. It may be used for either healing or hygiene purposes. Sitz baths are also used to relieve pain, itching, or muscle spasms. The water may contain medicine. Moist heat will help you heal and relax.  HOME CARE INSTRUCTIONS  Take 3 to 4 sitz baths a day. 1. Fill the bathtub half full with warm water. 2. Sit in the  water and open the drain a little. 3. Turn on the warm water to keep the tub half full. Keep the water running constantly. 4. Soak in the water for 15 to 20 minutes. 5. After the sitz bath, pat the affected area dry first.   4. KEEP YOUR BOWELS REGULAR a. The goal is one bowel movement a day b. Avoid getting constipated.  Between the surgery and the pain medications, it is common to experience some constipation.   Increasing fluid intake and taking a fiber supplement (such as Metamucil, Citrucel, FiberCon, MiraLax, etc) 2-3 times a day regularly will usually help prevent this problem from occurring.  A mild laxative (prune juice, Milk of Magnesia, MiraLax, etc) should be taken according to package directions if there are no bowel movements after 48 hours. c. Watch out for diarrhea.  If you have many loose bowel movements, simplify your diet to bland foods & liquids for a few days.  Stop any stool softeners and decrease your fiber supplement.  Switching to mild anti-diarrheal medications (Kayopectate, Pepto Bismol) can help.  If this worsens or does not improve, please call us.  5. Wound Care  a. Remove your bandages with your first bowel movement, usually the day after surgery.  You may have packing if you had an abscess.  Let any packing or gauze fall come out.   b. Wear an absorbent pad or soft cotton balls in your underwear as needed to catch any drainage and help keep the area  c. Keep the area clean and dry.  Bathe / shower every day.  Keep the area clean by showering / bathing over the incision / wound.   It is okay to soak an open wound to help wash it.  Consider using a squeeze bottle filled with warm water to gently wash the anal area.  Wet wipes or showers / gentle washing after bowel movements is often less traumatic than regular toilet paper. d. Dennis Bast will often notice bleeding with bowel movements.  This should slow down by the end of the first week of surgery.  Sitting on an ice pack can help. e. Expect some drainage.  This should slow down by the end of the first week of surgery, but you will have occasional bleeding or drainage up to a few months after surgery.  Wear an absorbent pad or soft cotton gauze in your underwear until the drainage stops.  6. ACTIVITIES as tolerated:   a. You may resume regular (light) daily activities beginning the next day--such as daily self-care, walking, climbing  stairs--gradually increasing activities as tolerated.  If you can walk 30 minutes without difficulty, it is safe to try more intense activity such as jogging, treadmill, bicycling, low-impact aerobics, swimming, etc. b. Save the most intensive and strenuous activity for last such as sit-ups, heavy lifting, contact sports, etc  Refrain from any heavy lifting or straining until you are off narcotics for pain control.   c. DO NOT PUSH THROUGH PAIN.  Let pain be your guide: If it hurts to do something, don't do it.  Pain is your body warning you to avoid that activity for another week until the pain goes down. d. You may drive when you are no longer taking prescription pain medication, you can comfortably sit for long periods of time, and you can safely maneuver your car and apply brakes. e. Dennis Bast may have sexual intercourse when it is comfortable.  7. FOLLOW UP in our office a. Please call [facility] at [phone] to set up an appointment to see your surgeon in the office for a follow-up appointment approximately 2-3 weeks after your surgery. b. Make sure that you call for this appointment the day you arrive home to ensure a convenient appointment time.  8. IF YOU HAVE DISABILITY OR FAMILY LEAVE FORMS, BRING THEM TO THE OFFICE FOR PROCESSING.  DO NOT GIVE THEM TO YOUR DOCTOR.        WHEN TO CALL us [phone]: 1. Poor pain control 2. Reactions / problems with new medications (rash/itching, nausea, etc)  3. Fever over 101.5 F (38.5 C) 4. Inability to urinate 5. Nausea and/or vomiting 6. Worsening swelling or bruising 7. Continued bleeding from incision. 8. Increased pain, redness, or drainage from the incision  The clinic staff is available to answer your questions during regular business hours (8:30am-5pm).  Please dont hesitate to call and ask to speak to one of our nurses for clinical concerns.   A surgeon from [facility] is always on call at the hospitals   If you have a  medical emergency, go to the nearest emergency room or call 911.    [facility], [place], [place], [place], [place]  [address] ? MAIN: [phone] ? TOLL FREE: [phone] ? FAX [phone] [email or web address]

## 2018-03-31 NOTE — Op Note (Signed)
03/31/2018  11:18 AM  PATIENT:  [name] [name]  62 y.o. 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   ANESTHESIA:   local and general  EBL:  Total I/O In: 3762 [I.V.:1717; IV Piggyback:50] Out: 225 [Urine:200; Blood:25]  Delay start of Pharmacological VTE agent (>24hrs) due to surgical blood loss or risk of bleeding:  no  DRAINS: none   SPECIMEN:  Proximal right colon  DISPOSITION OF SPECIMEN:  PATHOLOGY  COUNTS:  YES  PLAN OF CARE: Admit to inpatient   PATIENT DISPOSITION:  PACU - hemodynamically stable.  INDICATION:    Patient found to have a large mass in the appendix.  Hypodense mostly suspicious for a large mucocele.  Impinging into the cecum.   I recommended segmental resection:  The anatomy & physiology of the digestive tract was discussed.  The pathophysiology was discussed.  Natural history risks without surgery was discussed.   I worked to give an overview of the disease and the frequent need to have multispecialty involvement.  I feel the risks of no intervention will lead to serious problems that outweigh the operative risks; therefore, I recommended a partial colectomy to remove the pathology.  Laparoscopic & open techniques were discussed.   Risks such as bleeding, infection, abscess, leak, reoperation, possible ostomy, hernia, heart attack, death, and other risks were discussed.  I noted a good likelihood this will help address the problem.   Goals of post-operative recovery were discussed as well.  We will work to minimize complications.   An educational handout on the pathology was given as well.  Questions were answered.    The patient expresses understanding & wishes to proceed with surgery.  OR FINDINGS:   Patient had very dilated appendix with breach into the cecum.  Orifice somewhat firm suspicious for mass.  No perforation.  No spillage.  No obvious lymphadenopathy.  No obvious metastatic disease on visceral parietal peritoneum or liver.  It is an ileocolonic anastomosis that rests in the right flank.  DESCRIPTION:   Informed consent was confirmed.  The patient underwent general anaesthesia without difficulty.  The patient was positioned with arms tucked & secured appropriately.  VTE prevention in place.  The patient's abdomen was clipped, prepped, & draped in a sterile fashion.  Surgical timeout confirmed our plan.  The patient was positioned in reverse Trendelenburg.  Abdominal entry was gained using Veress needle technique using trach hook on the anterior abdominal wall fascia for countertension in the left upper abdomen.  Entry was clean.  I induced carbon dioxide insufflation.  Camera inspection revealed no injury.  Extra ports were carefully placed under direct laparoscopic visualization.  We docked the [employer] Vinci robot carefully and placed intstruments under visualization.  I mobilized & reflected the greater omentum and small bowel in the upper abdomen.  Could see an obviously dilated and enlarged appendix.  No spillage or other abnormality.  He had a very redundant colon even on the right side.  I was able to elevate the proximal colon to isolate the ileocolonic pedicle.  I scored the ileal mesentery just proximal  to that.   I carried that further dissection in a medial to lateral fashion.  I was able to bluntly get into the retro-mesenteric plane on the right side.  I freed the proximal right sided colonic mesentery off the retroperitoneum including the duodenal sweep, pancreatic head, & Gerota's fascia of the  right kidney. I was able to get underneath the hepatic flexure.  I was able to get underneath the proximal and mid transverse colon.  I isolated the proximal ileocecal pedicle.  I skeletonized it & transected the vessels.    I then proceeded to mobilize the terminal ileum & proximal "right" colon in a lateral to medial fashion.  I mobilized the distal ileal mesentery off its retroperitoneal and pelvic attachments.  I mobilized the ascending colon off It is side wall attachments to the paracolic gutter and retroperitoneum.  I also mobilized the greater omentum off the mid transverse colon and mobilized the mid to proximal transverse colon in a superior to inferior fashion.  This allowed me to mobilize the hepatic flexure and get a complete mobilization of the proximal "right" colon to the mid-transverse colon.  He had very redundant colon but we were able to safely free things off the retroperitoneum and off superior and lateral sidewall attachments.  I could isolate the pathology. When he went ahead and proceeded with transection.  I transected the distal ileal mesentery and then transected at the distal ileum with a robotic stapler.  I then transected transverse colon mesentery just proximal to a dominant middle colic arterial pedicle radially.  Transected at the proximal transverse colon with a robotic stapler.  We assured hemostasis.   I did a side-to-side stapled anastomosis of ileum to mid-transverse colon using a 71mm robotic stapler x 2 firings in an isoperistaltic fashion.  (Distal stump of ileum to mid transverse colon for the distal end of the anastomosis.  Proximal end of colon stump to more proximal ileum for the proximal end of the anastomosis).  I sewed the common staple channel wound with an absorbable suture ( 2-0 V-lock) in a running [place] fashion from each corner and meeting in the center.  I did meticulous inspection prove an airtight closure.  I protected the anastomosis line with an  anterior omentopexy of greater omentum using V lock suture.  We did reinspection of the abdomen.  Hemostasis was good.   Ureters, retroperitoneum, and bowel uninjured.  The anastomosis looked healthy.   We did a final irrigation of antibiotic solution (900 mg clindamycin/240 mg gentamicin in a liter of crystalloid) & held that while we placed the wound protector through the 75mm port site after it was enlarged in a Pfannenstiel fashion.  Specimen removed without incident.  Inspection revealed no active bleeding or other concerns.  Ports & wound protector removed.  We changed gloves & redraped the patient per colon SSI prevention protocol.  We aspirated the antibiotic irrigation.  Hemostasis was good.  Sterile unused instruments were used from this point.  I closed the skin at the port sites using Monocryl stitch and sterile dressing.  I closed the extraction wound using a 0 Vicryl vertical peritoneal closure and a #1 PDS transverse anterior rectal fascial closure like a small Pfannenstiel closure. I closed the skin with some interrupted Monocryl stitches. I placed antibiotic-soaked wicks into the closure at the corners x2.  I placed sterile dressings.     Patient is being extubated go to recovery room. I discussed postop care with the patient in detail  the office & with the patient and his spouse in the holding area. Instructions are written.  I updated the patient's status to his wife.  Recommendations were made.  Questions were answered.  The family expressed understanding & appreciation.  Adin Hector, M.D., F.A.C.S. Gastrointestinal and Minimally Invasive Surgery [facility] [address] [place], [place] [id number] [phone] Main / Paging

## 2018-03-31 NOTE — H&P (Signed)
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 CT scan suspicious for a mucocele. Rather large. Surgical consultation requested. He has a history of colon polyps. 5 years ago, no major issues. 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 No medications are treatments for that. He's never had abdominal surgery. He walks rather regularly. 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 Moves his bowels most days. He does not smoke. He is a diabetic controlled with oral hypoglycemics. Hemoglobin A1c in the mid fives.  No personal nor family history of GI/colon cancer, Crohn's disease, allergy such as Celiac Sprue, dietary/dairy problems, ulcers, nor gastritis. No recent sick contacts/gastroenteritis. No travel outside the country. No changes in diet. No dysphagia to solids or liquids. No significant heartburn or reflux. No hematochezia, hematemesis, coffee ground emesis. No evidence of prior gastric/peptic ulceration.  Ready for surgery  Past Surgical History Levonne Spiller, [place]; 02/20/2018 3:02 PM) Colon Polyp Removal - Colonoscopy Oral Surgery Vasectomy  Diagnostic Studies History Levonne Spiller, CMA; 02/20/2018 3:02 PM) Colonoscopy within last year  Allergies Levonne Spiller, [place]; 02/20/2018 3:02 PM) No Known Drug Allergies [02/20/2018]: Allergies Reconciled  Medication  History Levonne Spiller, CMA; 02/20/2018 3:04 PM) ClonazePAM (0.5MG  Tablet, Oral) Active. BuPROPion HCl ER (XL) (300MG  Tablet ER 24HR, Oral) Active. Lisinopril (10MG  Tablet, Oral) Active. MetFORMIN HCl (500MG  Tablet, Oral) Active. Synthroid (88MCG Tablet, Oral) Active. Naproxen (250MG  Tablet, Oral) Active. Viagra (100MG  Tablet, Oral) Active. Excedrin Extra Strength (250-250-65MG  Tablet, Oral) Active. Medications Reconciled  Social History Andee Poles Education officer, museum, CMA; 02/20/2018 3:02 PM) Alcohol use Remotely quit alcohol use. Caffeine use Carbonated beverages, Coffee. Illicit drug use Uses socially only. Tobacco use Former smoker.  Family History Levonne Spiller, CMA; 02/20/2018 3:02 PM) Alcohol Abuse Brother, Sister. Cancer Brother. Diabetes Mellitus Father. Heart Disease Father. Hypertension Brother. Respiratory Condition Father, Mother.  Other Problems Levonne Spiller, CMA; 02/20/2018 3:02 PM) Anxiety Disorder Cirrhosis Of Liver Depression Diabetes Mellitus Hepatitis Seizure Disorder     Review of Systems Andee Poles Gerrigner CMA; 02/20/2018 3:02 PM) General Not Present- Appetite Loss, Chills, Fatigue, Fever, Night Sweats, Weight Gain and Weight Loss. Skin Present- Dryness. Not Present- Change in Wart/Mole, Hives, Jaundice, New Lesions, Non-Healing Wounds, Rash and Ulcer. HEENT Present- Seasonal Allergies and Wears glasses/contact lenses. Not Present- Earache, Hearing Loss, Hoarseness, Nose Bleed, Oral Ulcers, Ringing in the Ears, Sinus Pain, Sore Throat, Visual Disturbances and Yellow Eyes. Respiratory Present- Snoring. Not Present- Bloody sputum, Chronic Cough, Difficulty Breathing and Wheezing. Breast Not Present- Breast Mass, Breast Pain, Nipple Discharge and Skin Changes. Cardiovascular Not Present- Chest Pain, Difficulty Breathing Lying Down, Leg Cramps, Palpitations, Rapid Heart Rate, Shortness of Breath and Swelling of  Extremities. Gastrointestinal Not Present- Abdominal Pain, Bloating, Bloody Stool, Change in Bowel Habits, Chronic diarrhea, Constipation, Difficulty Swallowing, Excessive gas, Gets full quickly at meals, Hemorrhoids, Indigestion, Nausea, Rectal Pain and Vomiting. Male Genitourinary Present- Impotence. Not Present- Blood in Urine, Change in Urinary Stream, Frequency, Nocturia, Painful Urination, Urgency and Urine Leakage. Musculoskeletal Not Present-  Back Pain, Joint Pain, Joint Stiffness, Muscle Pain, Muscle Weakness and Swelling of Extremities. Neurological Present- Headaches. Not Present- Decreased Memory, Fainting, Numbness, Seizures, Tingling, Tremor, Trouble walking and Weakness. Psychiatric Present- Anxiety and Depression. Not Present- Bipolar, Change in Sleep Pattern, Fearful and Frequent crying. Hematology Present- Blood Thinners. Not Present- Easy Bruising, Excessive bleeding, Gland problems, HIV and Persistent Infections.  Vitals (Danielle Gerrigner CMA; 02/20/2018 3:05 PM) 02/20/2018 3:04 PM Weight: 212 lb Height: 71in Body Surface Area: 2.16 m Body Mass Index: 29.57 kg/m  Temp.: 97.22F(Oral)  Pulse: 103 (Regular)  BP: 178/112 (Sitting, Right Arm, Standard)   BP (!) 144/90   Pulse 92   Temp 97.9 F (36.6 C) (Oral)   Resp 16   Ht 5' 10.5" (1.791 m)   Wt 92.8 kg (204 lb 8 oz)   SpO2 97%   BMI 28.93 kg/m     Physical Exam Adin Hector MD; 02/20/2018 3:41 PM)  General Mental Status-Alert. General Appearance-Not in acute distress, Not Sickly. Orientation-Oriented X3. Hydration-Well hydrated. Voice-Normal.  Integumentary Global Assessment Upon inspection and palpation of skin surfaces of the - Axillae: non-tender, no inflammation or ulceration, no drainage. and Distribution of scalp and body hair is normal. General Characteristics Temperature - normal warmth is noted.  Head and Neck Head-normocephalic, atraumatic with no lesions  or palpable masses. Face Global Assessment - atraumatic, no absence of expression. Neck Global Assessment - no abnormal movements, no bruit auscultated on the right, no bruit auscultated on the left, no decreased range of motion, non-tender. Trachea-midline. Thyroid Gland Characteristics - non-tender.  Eye Eyeball - Left-Extraocular movements intact, No Nystagmus. Eyeball - Right-Extraocular movements intact, No Nystagmus. Cornea - Left-No Hazy. Cornea - Right-No Hazy. Sclera/Conjunctiva - Left-No scleral icterus, No Discharge. Sclera/Conjunctiva - Right-No scleral icterus, No Discharge. Pupil - Left-Direct reaction to light normal. Pupil - Right-Direct reaction to light normal. Note: Wears glasses. Vision corrected  ENMT Ears Pinna - Left - no drainage observed, no generalized tenderness observed. Right - no drainage observed, no generalized tenderness observed. Nose and Sinuses External Inspection of the Nose - no destructive lesion observed. Inspection of the nares - Left - quiet respiration. Right - quiet respiration. Mouth and Throat Lips - Upper Lip - no fissures observed, no pallor noted. Lower Lip - no fissures observed, no pallor noted. Nasopharynx - no discharge present. Oral Cavity/Oropharynx - Tongue - no dryness observed. Oral Mucosa - no cyanosis observed. Hypopharynx - no evidence of airway distress observed.  Chest and Lung Exam Inspection Movements - Normal and Symmetrical. Accessory muscles - No use of accessory muscles in breathing. Palpation Palpation of the chest reveals - Non-tender. Auscultation Breath sounds - Normal and Clear.  Cardiovascular Auscultation Rhythm - Regular. Murmurs & Other Heart Sounds - Auscultation of the heart reveals - No Murmurs and No Systolic Clicks.  Abdomen Inspection Inspection of the abdomen reveals - No Visible peristalsis and No Abnormal pulsations. Umbilicus - No Bleeding, No Urine  drainage. Palpation/Percussion Palpation and Percussion of the abdomen reveal - Soft, Non Tender, No Rebound tenderness, No Rigidity (guarding) and No Cutaneous hyperesthesia. Note: Abdomen soft. Nontender. Moderate diastases recti. Perhaps very small umbilical hernia through stalk only. Not distended. No umbilical or incisional hernias. No guarding.  Male Genitourinary Sexual Maturity Tanner 5 - Adult hair pattern and Adult penile size and shape. Note: No inguinal hernias. No lymphadenopathy.  Peripheral Vascular Upper Extremity Inspection - Left - No Cyanotic nailbeds, Not Ischemic. Right - No Cyanotic nailbeds, Not Ischemic.  Neurologic Neurologic evaluation reveals -normal attention span and ability to concentrate, able to name objects and repeat phrases. Appropriate fund of knowledge , normal sensation and normal coordination. Mental Status Affect - not angry, not paranoid. Cranial Nerves-Normal Bilaterally. Gait-Normal.  Neuropsychiatric Mental status exam performed with findings of-able to articulate well with normal speech/language, rate, volume and coherence, thought content normal with ability to perform basic computations and apply abstract reasoning and no evidence of hallucinations, delusions, obsessions or homicidal/suicidal ideation.  Musculoskeletal Global Assessment Spine, Ribs and Pelvis - no instability, subluxation or laxity. Right Upper Extremity - no instability, subluxation or laxity.  Lymphatic Head & Neck  General Head & Neck Lymphatics: Bilateral - Description - No Localized lymphadenopathy. Axillary  General Axillary Region: Bilateral - Description - No Localized lymphadenopathy. Femoral & Inguinal  Generalized Femoral & Inguinal Lymphatics: Left - Description - No Localized lymphadenopathy. Right - Description - No Localized lymphadenopathy.    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 Confirmed by CT scan. I think he would benefit from resection. No evidence of rupture.  Given the large size and and protrusion of the cecum, I do not think I can barely do an appendectomy. Would plan ileocecectomy with intraportal anastomosis. Discuss my partner, Dr. Excell Seltzer, who agrees.  I'll long discussion with the patient and his spouse. Answer many questions. They expressed understanding and wish to proceed.   Written instructions provided The anatomy & physiology of the digestive tract was discussed. The pathophysiology of the colon was discussed. Natural history risks without surgery was discussed. I feel the risks of no intervention will lead to serious problems that outweigh the operative risks; therefore, I recommended a partial colectomy to remove the pathology. Minimally invasive (Robotic/Laparoscopic) & open techniques were discussed.  Risks such as bleeding, infection, abscess, leak, reoperation, possible ostomy, hernia, heart attack, death, and other risks were discussed. I noted a good likelihood this will help address the problem. Goals of post-operative recovery were discussed as well. Need for adequate nutrition, daily bowel regimen and healthy physical activity, to optimize recovery was noted as well. We will work to minimize complications. Educational materials were available as well. Questions were answered. The patient expresses understanding & wishes to proceed with surgery.    Adin Hector, M.D., F.A.C.S. Gastrointestinal and Minimally Invasive Surgery [facility] [address], [place], [place] [address] [phone] Main / Paging

## 2018-03-31 NOTE — Anesthesia Procedure Notes (Signed)
Procedure Name: Intubation Date/Time: 03/31/2018 7:33 AM Performed by: Sharlette Dense, CRNA Patient Re-evaluated:Patient Re-evaluated prior to induction Oxygen Delivery Method: Circle system utilized Preoxygenation: Pre-oxygenation with 100% oxygen Induction Type: IV induction Ventilation: Mask ventilation without difficulty and Oral airway inserted - appropriate to patient size Laryngoscope Size: Miller and 3 Grade View: Grade I Tube type: Oral Tube size: 8.0 mm Number of attempts: 1 Airway Equipment and Method: Stylet Placement Confirmation: ETT inserted through vocal cords under direct vision,  positive ETCO2 and breath sounds checked- equal and bilateral Secured at: 22 cm Tube secured with: Tape Dental Injury: Teeth and Oropharynx as per pre-operative assessment

## 2018-03-31 NOTE — Transfer of Care (Signed)
Immediate Anesthesia Transfer of Care Note  Patient: [name] [name]  Procedure(s) Performed: XI ROBOT PROXIMAL RIGHT COLECTOMY (N/A Abdomen)  Patient Location: PACU  Anesthesia Type:General  Level of Consciousness: sedated  Airway & Oxygen Therapy: Patient Spontanous Breathing and Patient connected to face mask oxygen  Post-op Assessment: Report given to RN and Post -op Vital signs reviewed and stable  Post vital signs: Reviewed and stable  Last Vitals:  Vitals Value Taken Time  BP 133/89 03/31/2018 10:43 AM  Temp    Pulse 99 03/31/2018 10:44 AM  Resp 21 03/31/2018 10:44 AM  SpO2 100 % 03/31/2018 10:44 AM  Vitals shown include unvalidated device data.  Last Pain:  Vitals:   03/31/18 0605  TempSrc:   PainSc: 0-No pain      Patients Stated Pain Goal: 4 (00/76/22 6333)  Complications: No apparent anesthesia complications

## 2018-04-01 LAB — CBC
HCT: 31.8 % — ABNORMAL LOW (ref 39.0–52.0)
HEMOGLOBIN: 10.7 g/dL — AB (ref 13.0–17.0)
MCH: 30.9 pg (ref 26.0–34.0)
MCHC: 33.6 g/dL (ref 30.0–36.0)
MCV: 91.9 fL (ref 78.0–100.0)
PLATELETS: 245 10*3/uL (ref 150–400)
RBC: 3.46 MIL/uL — ABNORMAL LOW (ref 4.22–5.81)
RDW: 12.6 % (ref 11.5–15.5)
WBC: 15.3 10*3/uL — ABNORMAL HIGH (ref 4.0–10.5)

## 2018-04-01 LAB — BASIC METABOLIC PANEL
ANION GAP: 8 (ref 5–15)
BUN: 19 mg/dL (ref 6–20)
CALCIUM: 8.3 mg/dL — AB (ref 8.9–10.3)
CO2: 25 mmol/L (ref 22–32)
CREATININE: 1.3 mg/dL — AB (ref 0.61–1.24)
Chloride: 100 mmol/L — ABNORMAL LOW (ref 101–111)
GFR, EST NON AFRICAN AMERICAN: 57 mL/min — AB (ref >60)
GLUCOSE: 130 mg/dL — AB (ref 65–99)
Potassium: 4.7 mmol/L (ref 3.5–5.1)
Sodium: 133 mmol/L — ABNORMAL LOW (ref 135–145)

## 2018-04-01 NOTE — Progress Notes (Signed)
1 Day Post-Op Lap R colectomy Subjective: Pain controlled, having bowel function, tolerating clears  Objective: Vital signs in last 24 hours: Temp:  [97.3 F (36.3 C)-99.1 F (37.3 C)] 99.1 F (37.3 C) (04/06 0516) Pulse Rate:  [88-107] 99 (04/06 0516) Resp:  [11-18] 17 (04/06 0516) BP: (94-165)/(63-99) 96/71 (04/06 0516) SpO2:  [97 %-100 %] 97 % (04/06 0516) Weight:  [96.5 kg (212 lb 11.9 oz)] 96.5 kg (212 lb 11.9 oz) (04/06 0516)   Intake/Output from previous day: 04/05 0701 - 04/06 0700 In: 2604.5 [P.O.:200; I.V.:2354.5; IV Piggyback:50] Out: 775 [Urine:750; Blood:25] Intake/Output this shift: Total I/O In: -  Out: 3 [Urine:2; Stool:1]   General appearance: alert and cooperative GI: soft, non-distended  Incision: no significant erythema  Lab Results:  Recent Labs    04/01/18 0445  WBC 15.3*  HGB 10.7*  HCT 31.8*  PLT 245   BMET Recent Labs    04/01/18 0445  NA 133*  K 4.7  CL 100*  CO2 25  GLUCOSE 130*  BUN 19  CREATININE 1.30*  CALCIUM 8.3*   PT/INR No results for input(s): LABPROT, INR in the last 72 hours. ABG No results for input(s): PHART, HCO3 in the last 72 hours.  Invalid input(s): PCO2, PO2  MEDS, Scheduled . acetaminophen  1,000 mg Oral Q6H  . alvimopan  12 mg Oral BID  . enoxaparin (LOVENOX) injection  40 mg Subcutaneous Q24H  . feeding supplement  237 mL Oral BID BM  . gabapentin  300 mg Oral BID  . lip balm  1 application Topical BID  . saccharomyces boulardii  250 mg Oral BID    Studies/Results: No results found.  Assessment: s/p Procedure(s): ROBOTIC ASSISTED PROXIMAL RIGHT COLECTOMY Patient Active Problem List   Diagnosis Date Noted  . Mucocele of appendix 02/20/2018  . Travel advice encounter 05/20/2017    Expected post op course  Plan: d/c foley Advance diet to soft foods SL IV PO pain meds Hopefully d/c tom   LOS: 1 day     .Rosario Adie, [facility],  [place]   04/01/2018 9:07 AM

## 2018-04-01 NOTE — Progress Notes (Signed)
Pharmacy Brief Note - Alvimopan (Entereg)  The standing order set for alvimopan (Entereg) now includes an automatic order to discontinue the drug after the patient has had a bowel movement. The change was approved by the [place] and the Medical Executive Committee.   This patient has had bowel movements documented by nursing (dose not given this am for + BM). Therefore, alvimopan has been discontinued. If there are questions, please contact the pharmacy at [phone].   Thank you-  Doreene Eland, PharmD, BCPS.   04/01/2018 2:02 PM

## 2018-04-02 MED ORDER — CALCIUM POLYCARBOPHIL 625 MG PO TABS
625.0000 mg | ORAL_TABLET | Freq: Two times a day (BID) | ORAL | Status: DC
  Administered 2018-04-02 – 2018-04-03 (×3): 625 mg via ORAL
  Filled 2018-04-02 (×3): qty 1

## 2018-04-02 MED ORDER — LOPERAMIDE HCL 2 MG PO CAPS
2.0000 mg | ORAL_CAPSULE | ORAL | Status: DC | PRN
  Administered 2018-04-02 (×3): 2 mg via ORAL
  Filled 2018-04-02 (×3): qty 1

## 2018-04-02 NOTE — Progress Notes (Signed)
2 Days Post-Op Lap R colectomy Subjective: Pain controlled, having uncontrolled bowel function, tolerating diet  Objective: Vital signs in last 24 hours: Temp:  [98.6 F (37 C)-99.1 F (37.3 C)] 98.6 F (37 C) (04/07 0530) Pulse Rate:  [94-100] 100 (04/07 0530) Resp:  [18-20] 20 (04/07 0530) BP: (95-115)/(50-69) 107/67 (04/07 0530) SpO2:  [95 %-100 %] 95 % (04/07 0530)   Intake/Output from previous day: 04/06 0701 - 04/07 0700 In: 0  Out: 3 [Urine:2; Stool:1] Intake/Output this shift: No intake/output data recorded.   General appearance: alert and cooperative GI: soft, non-distended  Incision: no significant erythema  Lab Results:  Recent Labs    04/01/18 0445  WBC 15.3*  HGB 10.7*  HCT 31.8*  PLT 245   BMET Recent Labs    04/01/18 0445  NA 133*  K 4.7  CL 100*  CO2 25  GLUCOSE 130*  BUN 19  CREATININE 1.30*  CALCIUM 8.3*   PT/INR No results for input(s): LABPROT, INR in the last 72 hours. ABG No results for input(s): PHART, HCO3 in the last 72 hours.  Invalid input(s): PCO2, PO2  MEDS, Scheduled . acetaminophen  1,000 mg Oral Q6H  . enoxaparin (LOVENOX) injection  40 mg Subcutaneous Q24H  . feeding supplement  237 mL Oral BID BM  . gabapentin  300 mg Oral BID  . lip balm  1 application Topical BID  . saccharomyces boulardii  250 mg Oral BID    Studies/Results: No results found.  Assessment: s/p Procedure(s): ROBOTIC ASSISTED PROXIMAL RIGHT COLECTOMY Patient Active Problem List   Diagnosis Date Noted  . Mucocele of appendix 02/20/2018  . Travel advice encounter 05/20/2017    Expected post op course  Plan: Fiber and imodium prn for diarrhea Cont soft foods SL IV PO pain meds Hopefully d/c tom   LOS: 2 days     .Rosario Adie, [facility], [place]   04/02/2018 8:33 AM

## 2018-04-03 ENCOUNTER — Encounter (HOSPITAL_COMMUNITY): Payer: Self-pay | Admitting: Surgery

## 2018-04-03 DIAGNOSIS — I10 Essential (primary) hypertension: Secondary | ICD-10-CM

## 2018-04-03 DIAGNOSIS — F329 Major depressive disorder, single episode, unspecified: Secondary | ICD-10-CM

## 2018-04-03 DIAGNOSIS — G4700 Insomnia, unspecified: Secondary | ICD-10-CM

## 2018-04-03 DIAGNOSIS — E119 Type 2 diabetes mellitus without complications: Secondary | ICD-10-CM

## 2018-04-03 DIAGNOSIS — E039 Hypothyroidism, unspecified: Secondary | ICD-10-CM

## 2018-04-03 DIAGNOSIS — B182 Chronic viral hepatitis C: Secondary | ICD-10-CM

## 2018-04-03 DIAGNOSIS — F411 Generalized anxiety disorder: Secondary | ICD-10-CM

## 2018-04-03 DIAGNOSIS — F32A Depression, unspecified: Secondary | ICD-10-CM

## 2018-04-03 HISTORY — DX: Insomnia, unspecified: G47.00

## 2018-04-03 HISTORY — DX: Essential (primary) hypertension: I10

## 2018-04-03 HISTORY — DX: Hypothyroidism, unspecified: E03.9

## 2018-04-03 HISTORY — DX: Depression, unspecified: F32.A

## 2018-04-03 HISTORY — DX: Chronic viral hepatitis C: B18.2

## 2018-04-03 LAB — GLUCOSE, CAPILLARY
GLUCOSE-CAPILLARY: 111 mg/dL — AB (ref 65–99)
Glucose-Capillary: 119 mg/dL — ABNORMAL HIGH (ref 65–99)

## 2018-04-03 MED ORDER — CLONAZEPAM 0.5 MG PO TABS: 0.2500 mg | ORAL_TABLET | Freq: Every day | ORAL | Status: DC

## 2018-04-03 MED ORDER — LACTATED RINGERS IV BOLUS: 1000.0000 mL | Freq: Three times a day (TID) | INTRAVENOUS | Status: DC | PRN

## 2018-04-03 MED ORDER — BUPROPION HCL ER (XL) 150 MG PO TB24
150.0000 mg | ORAL_TABLET | Freq: Every day | ORAL | Status: DC
  Administered 2018-04-03: 150 mg via ORAL
  Filled 2018-04-03: qty 1

## 2018-04-03 MED ORDER — LACTATED RINGERS IV BOLUS
1000.0000 mL | Freq: Once | INTRAVENOUS | Status: AC
  Administered 2018-04-03: 1000 mL via INTRAVENOUS

## 2018-04-03 MED ORDER — INSULIN ASPART 100 UNIT/ML ~~LOC~~ SOLN: 0.0000 [IU] | Freq: Every day | SUBCUTANEOUS | Status: DC

## 2018-04-03 MED ORDER — LISINOPRIL 10 MG PO TABS
10.0000 mg | ORAL_TABLET | Freq: Every day | ORAL | Status: DC
  Administered 2018-04-03: 10 mg via ORAL
  Filled 2018-04-03: qty 1

## 2018-04-03 MED ORDER — POLYVINYL ALCOHOL 1.4 % OP SOLN
1.0000 [drp] | Freq: Three times a day (TID) | OPHTHALMIC | Status: DC | PRN
  Filled 2018-04-03: qty 15

## 2018-04-03 MED ORDER — TRAMADOL HCL 50 MG PO TABS
50.0000 mg | ORAL_TABLET | Freq: Once | ORAL | Status: AC
  Administered 2018-04-03: 50 mg via ORAL
  Filled 2018-04-03: qty 1

## 2018-04-03 MED ORDER — ACETAMINOPHEN 500 MG PO TABS
1000.0000 mg | ORAL_TABLET | Freq: Three times a day (TID) | ORAL | Status: DC
  Administered 2018-04-03: 1000 mg via ORAL
  Filled 2018-04-03: qty 2

## 2018-04-03 MED ORDER — ASPIRIN-ACETAMINOPHEN-CAFFEINE 250-250-65 MG PO TABS
1.0000 | ORAL_TABLET | Freq: Two times a day (BID) | ORAL | Status: DC | PRN
  Filled 2018-04-03: qty 2

## 2018-04-03 MED ORDER — METFORMIN HCL 500 MG PO TABS
500.0000 mg | ORAL_TABLET | Freq: Two times a day (BID) | ORAL | Status: DC
  Administered 2018-04-03: 500 mg via ORAL
  Filled 2018-04-03: qty 1

## 2018-04-03 MED ORDER — ADULT MULTIVITAMIN W/MINERALS CH
1.0000 | ORAL_TABLET | Freq: Every day | ORAL | Status: DC
  Administered 2018-04-03: 1 via ORAL
  Filled 2018-04-03: qty 1

## 2018-04-03 MED ORDER — ASPIRIN EC 81 MG PO TBEC: 81.0000 mg | DELAYED_RELEASE_TABLET | Freq: Every day | ORAL | Status: DC

## 2018-04-03 MED ORDER — LEVOTHYROXINE SODIUM 88 MCG PO TABS
88.0000 ug | ORAL_TABLET | Freq: Every day | ORAL | Status: DC
  Administered 2018-04-03: 88 ug via ORAL
  Filled 2018-04-03: qty 1

## 2018-04-03 MED ORDER — INSULIN ASPART 100 UNIT/ML ~~LOC~~ SOLN: 0.0000 [IU] | Freq: Three times a day (TID) | SUBCUTANEOUS | Status: DC

## 2018-04-03 NOTE — Progress Notes (Signed)
Patient has met discharge criteria. Discharge and medication instructions reviewed with patient and spouse. Questions answered; both deny further questions. One prescription given to patient. Spouse is driving patient home. Donne Hazel, RN

## 2018-04-03 NOTE — Discharge Summary (Signed)
Physician Discharge Summary  Patient ID: [name] [name] MRN: [id number] DOB/AGE: Dec 15, 1956  62 y.o.  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  Postoperatively, the patient gradually mobilized and advanced to a solid diet.  Pain and other symptoms were treated aggressively.    By the time of discharge, the patient was walking well the hallways, eating food, having flatus.  He had some loose bowel movements that improved with solid diet and fiber bowel regimen. Home medications restarted.  Pain was controlled on an oral medications.  Based on meeting discharge criteria and continuing to recover, I felt it was safe for the patient to be discharged from the hospital to further recover with close followup. Postoperative recommendations were discussed in detail.  They are written as well.  Discharged Condition: good  Disposition:  Follow-up Information    Michael Boston, MD. Schedule an appointment as soon as possible for a visit in 3 weeks.   Specialty:  General Surgery Why:  To follow up after your operation, To follow up after your hospital stay Contact information: [place] [address] [phone]            Discharge disposition: 01-Home or Self Care       Discharge Instructions    Call MD for:   Complete by:  As directed    FEVER > 101.5 F  (temperatures < 101.5 F are not significant)   Call MD for:   Complete by:  As directed    FEVER > 101.5 F  (temperatures < 101.5 F are not significant)   Call MD for:  extreme fatigue   Complete by:  As directed    Call MD for:  extreme fatigue   Complete by:  As directed    Call MD for:  persistant dizziness or light-headedness   Complete by:  As directed    Call MD for:  persistant dizziness or light-headedness   Complete by:  As directed    Call MD for:  persistant nausea and vomiting   Complete by:  As directed    Call MD for:  persistant nausea and vomiting   Complete by:  As directed    Call MD for:  redness, tenderness, or signs of infection (pain, swelling, redness, odor or green/yellow discharge around incision site)   Complete by:  As directed    Call MD for:  redness, tenderness, or signs of infection (pain, swelling, redness, odor or green/yellow discharge around incision site)   Complete by:  As directed    Call MD for:  severe uncontrolled pain   Complete by:  As directed    Call MD for:  severe uncontrolled pain   Complete by:  As directed    Diet - low  sodium heart healthy   Complete by:  As directed    Start with a bland diet such as soups, liquids, starchy foods, low fat foods, etc. the first few days at home. Gradually advance to a solid, low-fat, high fiber diet by the end of the first week at home.   Add a fiber supplement to your diet (Metamucil, etc) If you feel full, bloated, or constipated, stay on a full liquid or pureed/blenderized diet for a few days until you feel better and are no longer constipated.   Diet - low sodium heart healthy   Complete by:  As directed    Start with a bland diet such as soups, liquids, starchy foods, low fat foods, etc. the first few days at home. Gradually advance to a  solid, low-fat, high fiber diet by the end of the first week at home.   Add a fiber supplement to your diet (Metamucil, etc) If you feel full, bloated, or constipated, stay on a full liquid or pureed/blenderized diet for a few days until you feel better and are no longer constipated.   Discharge instructions   Complete by:  As directed    See Discharge Instructions If you are not getting better after two weeks or are noticing you are getting worse, contact our office [phone] for further advice.  We may need to adjust your medications, re-evaluate you in the office, send you to the emergency room, or see what other things we can do to help. The clinic staff is available to answer your questions during regular business hours (8:30am-5pm).  Please don't hesitate to call and ask to speak to one of our nurses for clinical concerns.    A surgeon from [facility] is always on call at the hospitals 24 hours/day If you have a medical emergency, go to the nearest emergency room or call 911.   Discharge instructions   Complete by:  As directed    See Discharge Instructions If you are not getting better after two weeks or are noticing you are getting worse, contact our office [phone] for further advice.  We may need to adjust your medications, re-evaluate you in the office, send you to the emergency room, or see what other things we can do to help. The clinic staff is available to answer your questions during regular business hours (8:30am-5pm).  Please don't hesitate to call and ask to speak to one of our nurses for clinical concerns.    A surgeon from [facility] is always on call at the hospitals 24 hours/day If you have a medical emergency, go to the nearest emergency room or call 911.   Discharge wound care:   Complete by:  As directed    It is good for closed incision and even open wounds to be washed every day.  Shower every day.  Short baths are fine.  Wash the  incisions and wounds clean with soap & water.    If you have a closed incision(s), wash the incision with soap & water every day.  You may leave closed incisions open to air if it is dry.   You may cover the incision with clean gauze & replace it after your daily shower for comfort. If you have skin tapes (Steristrips) or skin glue (Dermabond) on your incision, leave them in place.  They will fall off on their own like a scab.  You may trim any edges that curl up with clean scissors.  If you have staples, set up an appointment for them to be removed in the office in 10 days after surgery.  If you have a drain, wash around the skin exit site with soap & water and place a new dressing of gauze or band aid around the skin every day.  Keep the drain site clean & dry.   Discharge wound care:   Complete by:  As directed    It is good for closed incision and even open wounds to be washed every day.  Shower every day.  Short baths are fine.  Wash the incisions and wounds clean with soap & water.    If you have a closed incision(s), wash the incision with soap & water every day.  You may leave closed incisions open to air if it is dry.   You may cover the incision with clean gauze & replace it after your daily shower for comfort. If you have skin tapes (Steristrips) or skin glue (Dermabond) on your incision, leave them in place.  They will fall off on their own like a scab.  You may trim any edges that curl up with clean scissors.  If you have staples, set up an appointment for them to be removed in the office in 10 days after surgery.  If you have a drain, wash around the skin exit site with soap & water and place a new dressing of gauze or band aid around the skin every day.  Keep the drain site clean & dry.   Driving Restrictions   Complete by:  As directed    You may drive when: - you are no longer taking narcotic prescription pain medication - you can comfortably wear a seatbelt - you can safely make  sudden turns/stops without pain.   Driving Restrictions   Complete by:  As directed    You may drive when: - you are no longer taking narcotic prescription pain medication - you can comfortably wear a seatbelt - you can safely make sudden turns/stops without pain.   Increase activity slowly   Complete by:  As directed    Start light daily activities --- self-care, walking, climbing stairs- beginning the day after surgery.  Gradually increase activities as tolerated.  Control your pain to be active.  Stop when you are tired.  Ideally, walk several times a day, eventually an hour a day.   Most people are back to most day-to-day activities in a few weeks.  It takes 4-6 weeks to get back to unrestricted, intense activity. If you can walk 30 minutes without difficulty, it is safe to try more intense activity such as jogging, treadmill, bicycling, low-impact aerobics, swimming, etc. Save the most intensive and strenuous activity for last (Usually 4-8 weeks after surgery) such as sit-ups, heavy lifting, contact sports, etc.  Refrain from any intense heavy lifting or straining until you are off narcotics for pain control.  You will have off days, but things should improve week-by-week. DO NOT PUSH THROUGH PAIN.  Let pain be your guide: If it hurts to do something, don't do it.   Increase activity slowly   Complete by:  As directed    Start light daily activities --- self-care, walking, climbing stairs- beginning the day after surgery.  Gradually increase activities as tolerated.  Control your pain to be active.  Stop when you are tired.  Ideally, walk several times a day, eventually an hour a day.   Most people are back to most day-to-day  activities in a few weeks.  It takes 4-6 weeks to get back to unrestricted, intense activity. If you can walk 30 minutes without difficulty, it is safe to try more intense activity such as jogging, treadmill, bicycling, low-impact aerobics, swimming, etc. Save the most  intensive and strenuous activity for last (Usually 4-8 weeks after surgery) such as sit-ups, heavy lifting, contact sports, etc.  Refrain from any intense heavy lifting or straining until you are off narcotics for pain control.  You will have off days, but things should improve week-by-week. DO NOT PUSH THROUGH PAIN.  Let pain be your guide: If it hurts to do something, don't do it.   Lifting restrictions   Complete by:  As directed    If you can walk 30 minutes without difficulty, it is safe to try more intense activity such as jogging, treadmill, bicycling, low-impact aerobics, swimming, etc. Save the most intensive and strenuous activity for last (Usually 4-8 weeks after surgery) such as sit-ups, heavy lifting, contact sports, etc.   Refrain from any intense heavy lifting or straining until you are off narcotics for pain control.  You will have off days, but things should improve week-by-week. DO NOT PUSH THROUGH PAIN.  Let pain be your guide: If it hurts to do something, don't do it.  Pain is your body warning you to avoid that activity for another week until the pain goes down.   Lifting restrictions   Complete by:  As directed    If you can walk 30 minutes without difficulty, it is safe to try more intense activity such as jogging, treadmill, bicycling, low-impact aerobics, swimming, etc. Save the most intensive and strenuous activity for last (Usually 4-8 weeks after surgery) such as sit-ups, heavy lifting, contact sports, etc.   Refrain from any intense heavy lifting or straining until you are off narcotics for pain control.  You will have off days, but things should improve week-by-week. DO NOT PUSH THROUGH PAIN.  Let pain be your guide: If it hurts to do something, don't do it.  Pain is your body warning you to avoid that activity for another week until the pain goes down.   May shower / Bathe   Complete by:  As directed    May shower / Bathe   Complete by:  As directed    May walk up  steps   Complete by:  As directed    May walk up steps   Complete by:  As directed    Sexual Activity Restrictions   Complete by:  As directed    You may have sexual intercourse when it is comfortable. If it hurts to do something, stop.   Sexual Activity Restrictions   Complete by:  As directed    You may have sexual intercourse when it is comfortable. If it hurts to do something, stop.      Allergies as of 04/03/2018   No Known Allergies     Medication List    TAKE these medications   aspirin EC 81 MG tablet Take 81 mg by mouth daily with supper.   aspirin-acetaminophen-caffeine 250-250-65 MG tablet Commonly known as:  EXCEDRIN MIGRAINE Take 1-2 tablets by mouth 2 (two) times daily as needed for headache.   ASTRAGALUS PO Take 100 mg by mouth daily.   buPROPion 300 MG 24 hr tablet Commonly known as:  WELLBUTRIN XL Take 150 mg by mouth daily.   clonazePAM 0.5 MG tablet Commonly known as:  KLONOPIN Take 0.25 mg  by mouth at bedtime.   levothyroxine 88 MCG tablet Commonly known as:  SYNTHROID, LEVOTHROID Take 88 mcg by mouth daily before breakfast.   lisinopril 10 MG tablet Commonly known as:  PRINIVIL,ZESTRIL Take 10 mg by mouth daily.   metFORMIN 500 MG tablet Commonly known as:  GLUCOPHAGE Take 500 mg by mouth 2 (two) times daily with a meal. Breakfast & supper   multivitamin with minerals Tabs tablet Take 1 tablet by mouth daily.   sildenafil 100 MG tablet Commonly known as:  VIAGRA Take 100 mg by mouth daily as needed for erectile dysfunction.   SYSTANE BALANCE 0.6 % Soln Generic drug:  Propylene Glycol Place 1-2 drops into both eyes 3 (three) times daily as needed (for dry eyes.).   traMADol 50 MG tablet Commonly known as:  ULTRAM Take 1-2 tablets (50-100 mg total) by mouth every 6 (six) hours as needed for moderate pain or severe pain.            Discharge Care Instructions  (From admission, onward)        Start     Ordered   04/03/18 0000   Discharge wound care:    Comments:  It is good for closed incision and even open wounds to be washed every day.  Shower every day.  Short baths are fine.  Wash the incisions and wounds clean with soap & water.    If you have a closed incision(s), wash the incision with soap & water every day.  You may leave closed incisions open to air if it is dry.   You may cover the incision with clean gauze & replace it after your daily shower for comfort. If you have skin tapes (Steristrips) or skin glue (Dermabond) on your incision, leave them in place.  They will fall off on their own like a scab.  You may trim any edges that curl up with clean scissors.  If you have staples, set up an appointment for them to be removed in the office in 10 days after surgery.  If you have a drain, wash around the skin exit site with soap & water and place a new dressing of gauze or band aid around the skin every day.  Keep the drain site clean & dry.   04/03/18 0800   03/31/18 0000  Discharge wound care:    Comments:  It is good for closed incision and even open wounds to be washed every day.  Shower every day.  Short baths are fine.  Wash the incisions and wounds clean with soap & water.    If you have a closed incision(s), wash the incision with soap & water every day.  You may leave closed incisions open to air if it is dry.   You may cover the incision with clean gauze & replace it after your daily shower for comfort. If you have skin tapes (Steristrips) or skin glue (Dermabond) on your incision, leave them in place.  They will fall off on their own like a scab.  You may trim any edges that curl up with clean scissors.  If you have staples, set up an appointment for them to be removed in the office in 10 days after surgery.  If you have a drain, wash around the skin exit site with soap & water and place a new dressing of gauze or band aid around the skin every day.  Keep the drain site clean & dry.   03/31/18 8938  Significant Diagnostic Studies:  Results for orders placed or performed during the hospital encounter of 03/31/18 (from the past 72 hour(s))  Glucose, capillary     Status: Abnormal   Collection Time: 03/31/18  5:22 PM  Result Value Ref Range   Glucose-Capillary 166 (H) 65 - 99 mg/dL  Glucose, capillary     Status: Abnormal   Collection Time: 03/31/18  9:24 PM  Result Value Ref Range   Glucose-Capillary 232 (H) 65 - 99 mg/dL  Basic metabolic panel     Status: Abnormal   Collection Time: 04/01/18  4:45 AM  Result Value Ref Range   Sodium 133 (L) 135 - 145 mmol/L   Potassium 4.7 3.5 - 5.1 mmol/L   Chloride 100 (L) 101 - 111 mmol/L   CO2 25 22 - 32 mmol/L   Glucose, Bld 130 (H) 65 - 99 mg/dL   BUN 19 6 - 20 mg/dL   Creatinine, Ser 1.30 (H) 0.61 - 1.24 mg/dL   Calcium 8.3 (L) 8.9 - 10.3 mg/dL   GFR calc non Af Amer 57 (L) >60 mL/min   GFR calc Af Amer >60 >60 mL/min    Comment: (NOTE) The eGFR has been calculated using the CKD EPI equation. This calculation has not been validated in all clinical situations. eGFR's persistently <60 mL/min signify possible Chronic Kidney Disease.    Anion gap 8 5 - 15    Comment: Performed at [facility], [place] [address], [place], [place] [address]  CBC     Status: Abnormal   Collection Time: 04/01/18  4:45 AM  Result Value Ref Range   WBC 15.3 (H) 4.0 - 10.5 K/uL   RBC 3.46 (L) 4.22 - 5.81 MIL/uL   Hemoglobin 10.7 (L) 13.0 - 17.0 g/dL   HCT 31.8 (L) 39.0 - 52.0 %   MCV 91.9 78.0 - 100.0 fL   MCH 30.9 26.0 - 34.0 pg   MCHC 33.6 30.0 - 36.0 g/dL   RDW 12.6 11.5 - 15.5 %   Platelets 245 150 - 400 K/uL    Comment: Performed at [facility], [place] [address] [place], [place] [address]  Glucose, capillary     Status: Abnormal   Collection Time: 04/03/18  7:48 AM  Result Value Ref Range   Glucose-Capillary 111 (H) 65 - 99 mg/dL  Glucose, capillary     Status: Abnormal   Collection Time: 04/03/18 11:57  AM  Result Value Ref Range   Glucose-Capillary 119 (H) 65 - 99 mg/dL    No results found.  Discharge Exam: Blood pressure 125/65, pulse (!) 104, temperature 99.5 F (37.5 C), temperature source Oral, resp. rate 16, height 5' 10.5" (1.791 m), weight 93 kg (205 lb), SpO2 94 %.  General: Pt awake/alert/oriented x4 in No acute distress Eyes: PERRL, normal EOM.  Sclera clear.  No icterus Neuro: CN II-XII intact w/o focal sensory/motor deficits. Lymph: No head/neck/groin lymphadenopathy Psych:  No delerium/psychosis/paranoia HENT: Normocephalic, Mucus membranes moist.  No thrush Neck: Supple, No tracheal deviation Chest: No chest wall pain w good excursion CV:  Pulses intact.  Regular rhythm MS: Normal AROM mjr joints.  No obvious deformity Abdomen: Soft.  Nondistended.  Mildly tender at incisions only.  No evidence of peritonitis.  No incarcerated hernias. Ext:  SCDs BLE.  No mjr edema.  No cyanosis Skin: No petechiae / purpura  Past Medical History:  Diagnosis Date  . Depression 04/03/2018  . Diabetes mellitus without complication ([place])   .  Hep C w/ coma, chronic ([place])   . Hepatitis C virus carrier  04/03/2018  . HTN (hypertension) 04/03/2018  . Hypothyroidism 04/03/2018  . Insomnia 04/03/2018  . 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 COLONOSCOPY  12/2017    Social History   Socioeconomic History  . Marital status: Married    Spouse name: Not on file  . Number of children: Not on file  . Years of education: Not on file  . Highest education level: Not on file  Occupational History  . Not on file  Social Needs  . Financial resource strain: Not on file  . Food insecurity:    Worry: Not on file    Inability: Not on file  . Transportation needs:    Medical: Not on file    Non-medical: Not on file  Tobacco Use  . Smoking status: Former Smoker    Last attempt to quit: 02/15/1984    Years since  quitting: 34.1  . Smokeless tobacco: Never Used  Substance and Sexual Activity  . Alcohol use: Not Currently    Comment: rarely  . Drug use: Not Currently  . Sexual activity: Not on file  Lifestyle  . Physical activity:    Days per week: Not on file    Minutes per session: Not on file  . Stress: Not on file  Relationships  . Social connections:    Talks on phone: Not on file    Gets together: Not on file    Attends religious service: Not on file    Active member of club or organization: Not on file    Attends meetings of clubs or organizations: Not on file    Relationship status: Not on file  . Intimate partner violence:    Fear of current or ex partner: Not on file    Emotionally abused: Not on file    Physically abused: Not on file    Forced sexual activity: Not on file  Other Topics Concern  . Not on file  Social History Narrative  . Not on file    History reviewed. No pertinent family history.  Current Facility-Administered Medications  Medication Dose Route Frequency Provider Last Rate Last Dose  . acetaminophen (TYLENOL) tablet 1,000 mg  1,000 mg Oral TID Michael Boston, MD   1,000 mg at 04/03/18 1050  . alum & mag hydroxide-simeth (MAALOX/MYLANTA) 200-200-20 MG/5ML suspension 30 mL  30 mL Oral Q6H PRN Michael Boston, MD      . aspirin EC tablet 81 mg  81 mg Oral Q supper Michael Boston, MD      . aspirin-acetaminophen-caffeine [facility] MIGRAINE) per tablet 1-2 tablet  1-2 tablet Oral BID PRN Michael Boston, MD      . buPROPion (WELLBUTRIN XL) 24 hr tablet 150 mg  150 mg Oral Daily Michael Boston, MD   150 mg at 04/03/18 1050  . clonazePAM (KLONOPIN) tablet 0.25 mg  0.25 mg Oral QHS Minda Ditto, RPH      . diphenhydrAMINE (BENADRYL) 12.5 MG/5ML elixir 12.5 mg  12.5 mg Oral Q6H PRN Michael Boston, MD       Or  . diphenhydrAMINE (BENADRYL) injection 12.5 mg  12.5 mg Intravenous Q6H PRN Michael Boston, MD      . enoxaparin (LOVENOX) injection 40 mg  40 mg Subcutaneous Q24H  Michael Boston, MD   40 mg at 04/03/18 0820  . feeding supplement (ENSURE SURGERY) liquid  237 mL  237 mL Oral BID BM Michael Boston, MD   237 mL at 04/03/18 1010  . gabapentin (NEURONTIN) capsule 300 mg  300 mg Oral BID Michael Boston, MD   300 mg at 04/03/18 1010  . guaiFENesin-dextromethorphan (ROBITUSSIN DM) 100-10 MG/5ML syrup 10 mL  10 mL Oral Q4H PRN Michael Boston, MD      . hydrALAZINE (APRESOLINE) injection 10 mg  10 mg Intravenous Q2H PRN Michael Boston, MD      . hydrocortisone (ANUSOL-HC) 2.5 % rectal cream 1 application  1 application Topical QID PRN Michael Boston, MD      . hydrocortisone cream 1 % 1 application  1 application Topical TID PRN Michael Boston, MD      . HYDROmorphone (DILAUDID) injection 0.5-2 mg  0.5-2 mg Intravenous Q2H PRN Michael Boston, MD      . insulin aspart (novoLOG) injection 0-15 Units  0-15 Units Subcutaneous TID WC Michael Boston, MD      . insulin aspart (novoLOG) injection 0-5 Units  0-5 Units Subcutaneous QHS Michael Boston, MD      . lactated ringers bolus 1,000 mL  1,000 mL Intravenous Q8H PRN , Remo Lipps, MD      . levothyroxine (SYNTHROID, LEVOTHROID) tablet 88 mcg  88 mcg Oral QAC breakfast Michael Boston, MD   88 mcg at 04/03/18 1000  . lip balm (CARMEX) ointment 1 application  1 application Topical BID Michael Boston, MD   1 application at 79/03/83 1011  . lisinopril (PRINIVIL,ZESTRIL) tablet 10 mg  10 mg Oral Daily Michael Boston, MD   10 mg at 04/03/18 1050  . loperamide (IMODIUM) capsule 2 mg  2 mg Oral PRN Leighton Ruff, MD   2 mg at 33/83/29 2135  . magic mouthwash  15 mL Oral QID PRN Michael Boston, MD      . menthol-cetylpyridinium (CEPACOL) lozenge 3 mg  1 lozenge Oral PRN Michael Boston, MD      . metFORMIN (GLUCOPHAGE) tablet 500 mg  500 mg Oral BID WC Michael Boston, MD   500 mg at 04/03/18 0900  . metoCLOPramide (REGLAN) injection 10 mg  10 mg Intravenous Q6H PRN Michael Boston, MD      . metoCLOPramide (REGLAN) injection 10 mg  10 mg Intravenous  Q6H PRN Michael Boston, MD   10 mg at 03/31/18 1834  . metoprolol tartrate (LOPRESSOR) injection 5 mg  5 mg Intravenous Q6H PRN Michael Boston, MD      . multivitamin with minerals tablet 1 tablet  1 tablet Oral Daily Michael Boston, MD   1 tablet at 04/03/18 1051  . ondansetron (ZOFRAN) tablet 4 mg  4 mg Oral Q6H PRN Michael Boston, MD       Or  . ondansetron [facility]) injection 4 mg  4 mg Intravenous Q6H PRN Michael Boston, MD   4 mg at 03/31/18 1446  . phenol (CHLORASEPTIC) mouth spray 1-2 spray  1-2 spray Mouth/Throat PRN Michael Boston, MD      . polycarbophil (FIBERCON) tablet 625 mg  625 mg Oral BID Leighton Ruff, MD   191 mg at 04/03/18 1011  . polyvinyl alcohol (LIQUIFILM TEARS) 1.4 % ophthalmic solution 1-2 drop  1-2 drop Both Eyes TID PRN Michael Boston, MD      . prochlorperazine (COMPAZINE) tablet 10 mg  10 mg Oral Q6H PRN Michael Boston, MD       Or  . prochlorperazine (COMPAZINE) injection 5-10 mg  5-10 mg Intravenous Q6H PRN Michael Boston, MD      .  saccharomyces boulardii (FLORASTOR) capsule 250 mg  250 mg Oral BID Michael Boston, MD   250 mg at 04/03/18 1012  . traMADol (ULTRAM) tablet 50-100 mg  50-100 mg Oral Q6H PRN Michael Boston, MD   50 mg at 04/01/18 1508   Current Outpatient Medications  Medication Sig Dispense Refill  . aspirin EC 81 MG tablet Take 81 mg by mouth daily with supper.    . ASTRAGALUS PO Take 100 mg by mouth daily.     Marland Kitchen buPROPion (WELLBUTRIN XL) 300 MG 24 hr tablet Take 150 mg by mouth daily.     . clonazePAM (KLONOPIN) 0.5 MG tablet Take 0.25 mg by mouth at bedtime.     Marland Kitchen levothyroxine (SYNTHROID, LEVOTHROID) 88 MCG tablet Take 88 mcg by mouth daily before breakfast.    . lisinopril (PRINIVIL,ZESTRIL) 10 MG tablet Take 10 mg by mouth daily.    . metFORMIN (GLUCOPHAGE) 500 MG tablet Take 500 mg by mouth 2 (two) times daily with a meal. Breakfast & supper    . Multiple Vitamin (MULTIVITAMIN WITH MINERALS) TABS tablet Take 1 tablet by mouth daily.    Marland Kitchen Propylene  Glycol (SYSTANE BALANCE) 0.6 % SOLN Place 1-2 drops into both eyes 3 (three) times daily as needed (for dry eyes.).    Marland Kitchen sildenafil (VIAGRA) 100 MG tablet Take 100 mg by mouth daily as needed for erectile dysfunction.     Marland Kitchen aspirin-acetaminophen-caffeine (EXCEDRIN MIGRAINE) 250-250-65 MG tablet Take 1-2 tablets by mouth 2 (two) times daily as needed for headache.    . traMADol (ULTRAM) 50 MG tablet Take 1-2 tablets (50-100 mg total) by mouth every 6 (six) hours as needed for moderate pain or severe pain. 30 tablet 0     No Known Allergies  Signed: Morton Peters, M.D., F.A.C.S. Gastrointestinal and Minimally Invasive Surgery [facility] [address], [place], [place] [address] [phone] Main / Paging   04/03/2018, 2:58 PM

## 2018-04-03 NOTE — Progress Notes (Addendum)
[facility]  [address] [place], [place] [address] Phone: [phone]  FAX: [place] [phone] 04/12/1956  CARE TEAM:  PCP: Levin Erp, MD  Outpatient Care Team: Patient Care Team: Levin Erp, MD as PCP - General (Internal Medicine) Michael Boston, MD as Consulting Physician (General Surgery) Wilford Corner, MD as Consulting Physician (Gastroenterology)  Inpatient Treatment Team: Treatment Team: Attending Provider: Michael Boston, MD; Registered Nurse: Vicente Serene, RN; Technician: Etheleen Sia, NT; Technician: Ananias Pilgrim, NT   Problem List:   Principal Problem:   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  Maybe home later today if feeling better  Plan:  -adv diet -fiber bowel regimen -f/u path - discussed OR finding s w pt & wife again -Try to encourage better pain control (just assuming only tylenol & gabapentin available) -synthroid for hypothyroidism -HTN control -DM control -VTE prophylaxis- SCDs, etc -mobilize as tolerated to help recovery  D/C patient from hospital when patient meets criteria (anticipate in 0-2 day(s)):  Tolerating oral intake well Ambulating well Adequate pain control without IV medications Urinating  Having flatus Disposition planning in place   20 minutes spent in review, evaluation, examination, counseling, and coordination of care.  More than 50% of that time was spent in counseling.  Adin Hector, M.D., F.A.C.S. Gastrointestinal and Minimally Invasive [facility] [address], [place], [place] [address] [phone] Main / Paging   04/03/2018    Subjective: (Chief complaint)  Sore Walking Loose BMs yesterday - better today Wife at bedside  Objective:  Vital signs:  Vitals:   04/02/18 0530 04/02/18 1400 04/02/18 2100 04/03/18 0505  BP: 107/67 108/61 127/66 125/65  Pulse: 100 (!) 105 (!) 106 (!) 104  Resp: 20 18 20 16   Temp: 98.6 F (37 C) 99.2 F (37.3 C) 98.6 F (37 C) 99.5 F (37.5 C)  TempSrc: Oral Oral Oral Oral  SpO2: 95% 97% 98% 94%  Weight:    93 kg (205 lb)  Height:        Last BM Date: 04/02/18  Intake/Output   Yesterday:  04/07 0701 - 04/08 0700 In: 120 [P.O.:120] Out: 600 [Urine:600] This shift:  No intake/output data recorded.  Bowel function:  Flatus: YES  BM:  YES  Drain: (No drain)   Physical Exam:  General: Pt awake/alert/oriented x4 in no acute distress Eyes: PERRL, normal EOM.  Sclera clear.  No icterus Neuro: CN II-XII intact w/o focal sensory/motor deficits. Lymph: No head/neck/groin lymphadenopathy Psych:  No delerium/psychosis/paranoia HENT: Normocephalic, Mucus membranes moist.  No thrush Neck: Supple, No tracheal deviation Chest: No chest wall pain w good excursion CV:  Pulses intact.  Regular rhythm MS: Normal AROM mjr joints.  No obvious deformity  Abdomen: Soft.  Nondistended.  Mildly tender at incisions only.  Some ecchymosis at incision & genitals - resolving.  No evidence of peritonitis.  No incarcerated hernias.  Ext:  No deformity.  No mjr edema.  No cyanosis Skin: No  petechiae / purpura  Results:   Labs: No results found for this or any previous visit (from the past 48 hour(s)).  Imaging / Studies: No results found.  Medications / Allergies: per chart  Antibiotics: Anti-infectives (From admission, onward)   Start     Dose/Rate Route Frequency Ordered Stop   03/31/18 2000  cefoTEtan (CEFOTAN) 2 g in sodium chloride 0.9 %  100 mL IVPB  Status:  Discontinued     2 g 200 mL/hr over 30 Minutes Intravenous Every 12 hours 03/31/18 1212 03/31/18 1223   03/31/18 2000  cefoTEtan in Dextrose 5% (CEFOTAN) IVPB 2 g     2 g Intravenous Every 12 hours 03/31/18 1223 03/31/18 2014   03/31/18 1005  clindamycin (CLEOCIN) 900 mg, gentamicin (GARAMYCIN) 240 mg in sodium chloride 0.9 % 1,000 mL for intraperitoneal lavage  Status:  Discontinued       As needed 03/31/18 1020 03/31/18 1041   03/31/18 0600  clindamycin (CLEOCIN) 900 mg, gentamicin (GARAMYCIN) 240 mg in sodium chloride 0.9 % 1,000 mL for intraperitoneal lavage  Status:  Discontinued      Intraperitoneal To Surgery 03/31/18 0548 03/31/18 1156   03/31/18 0550  neomycin (MYCIFRADIN) tablet 1,000 mg  Status:  Discontinued     1,000 mg Oral 3 times per day 03/31/18 0550 03/31/18 1156   03/31/18 0550  metroNIDAZOLE (FLAGYL) tablet 1,000 mg  Status:  Discontinued     1,000 mg Oral 3 times per day 03/31/18 0550 03/31/18 1156   03/31/18 0548  cefoTEtan in Dextrose 5% (CEFOTAN) IVPB 2 g     2 g Intravenous On call to O.R. 03/31/18 0548 03/31/18 0750        Note: Portions of this report may have been transcribed using voice recognition software. Every effort was made to ensure accuracy; however, inadvertent computerized transcription errors may be present.   Any transcriptional errors that result from this process are unintentional.     Adin Hector, M.D., F.A.C.S. Gastrointestinal and Minimally Invasive Surgery [facility] [address], [place], [place] [address] [phone] Main / Paging   04/03/2018

## 2018-04-13 IMAGING — CT CT ABD-PELV W/ CM
1 of 2 series · 13 of 32 positions shown, 17 images · IV contrast (APPLIED)
Comparison: February 06, 2010

CLINICAL DATA: Abnormality in cecal region on colonoscopy.

EXAM:
CT ABDOMEN AND PELVIS WITH CONTRAST
TECHNIQUE: Multidetector CT imaging of the abdomen and pelvis was performed
using the standard protocol following bolus administration of
intravenous contrast. Oral contrast was also administered.
BUN 17; creatinine 0.9; GFR 91
CONTRAST:  125mL CHQLVO-HRR IOPAMIDOL (CHQLVO-HRR) INJECTION 61%

[Series 2: abd/pelvis w/cm · axial · 0.78mm/px · z∈[-460,-30]mm · 13 of 100 slices shown, 17 images]
[im 9/100  soft-tissue]
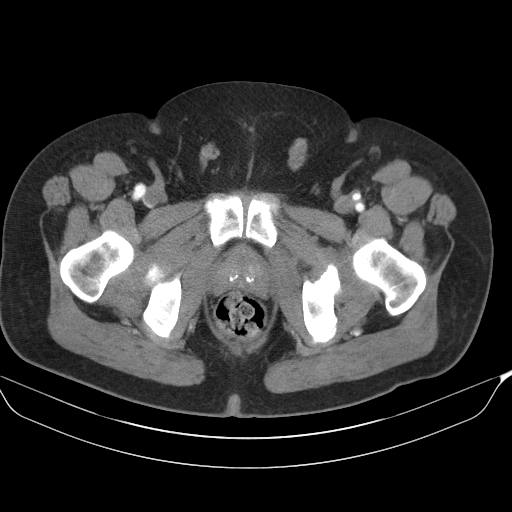
[im 9/100  bone]
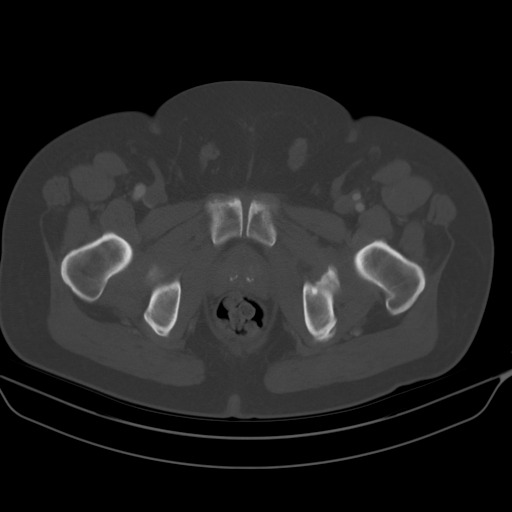
[im 17/100  soft-tissue]
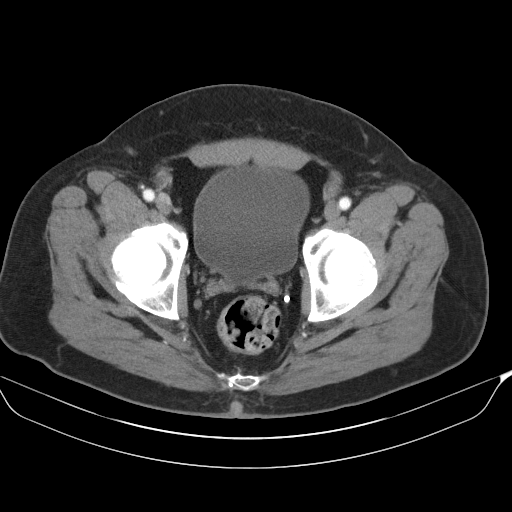
[im 25/100  soft-tissue]
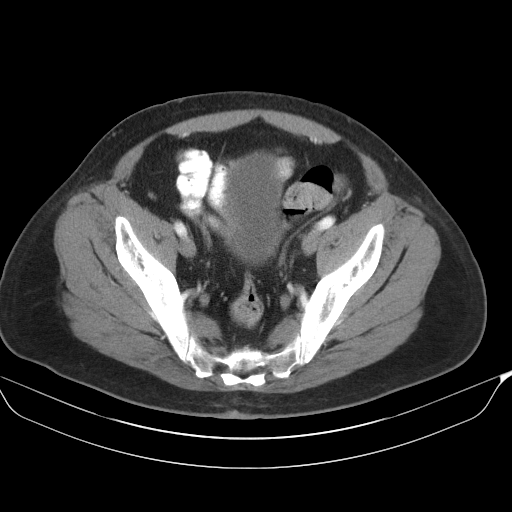
[im 34/100  soft-tissue]
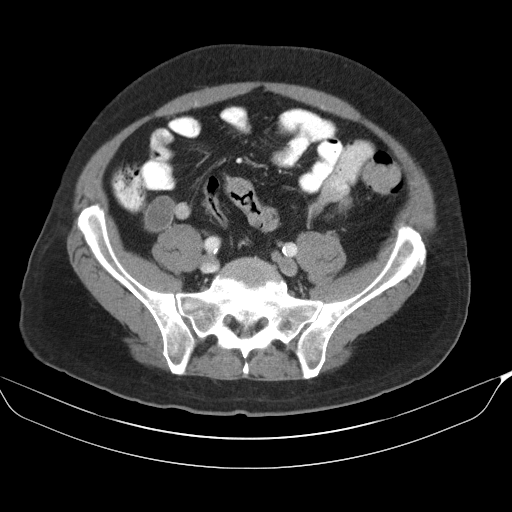
[im 42/100  soft-tissue]
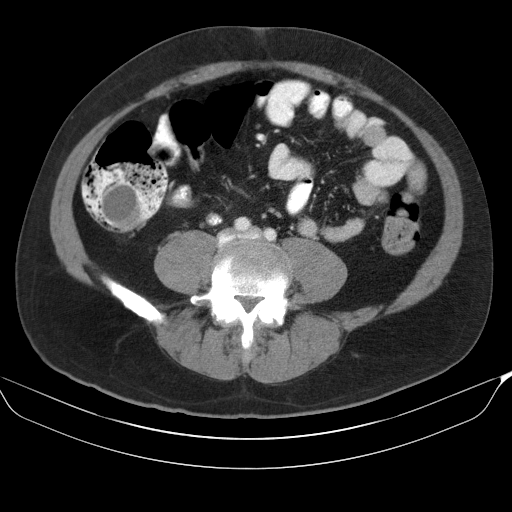
[im 50/100  soft-tissue]
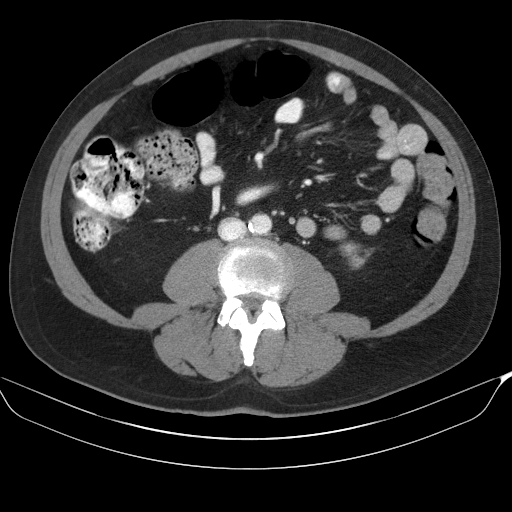
[im 58/100  soft-tissue]
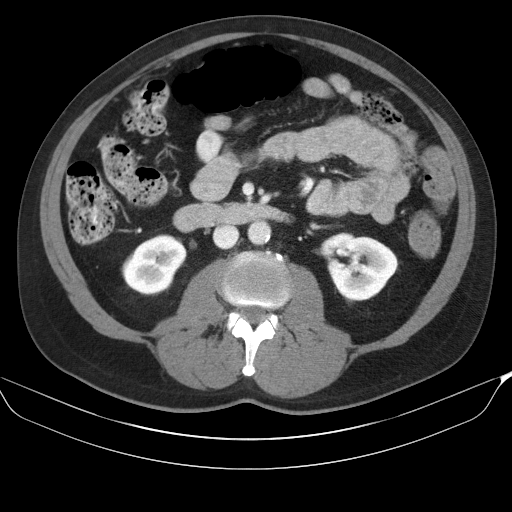
[im 67/100  soft-tissue]
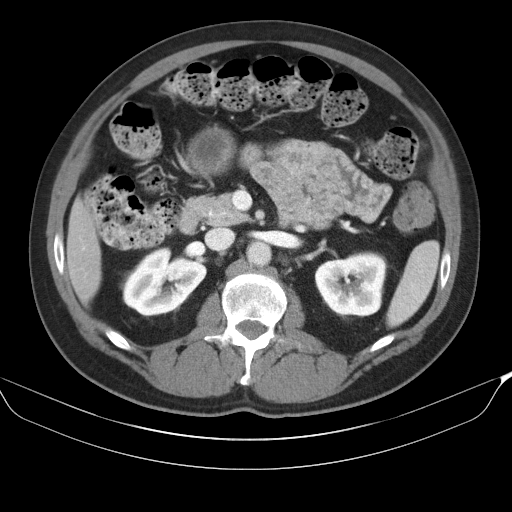
[im 75/100  soft-tissue]
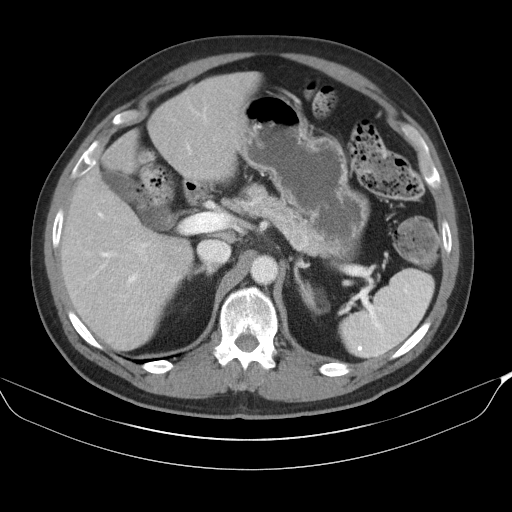
[im 75/100  bone]
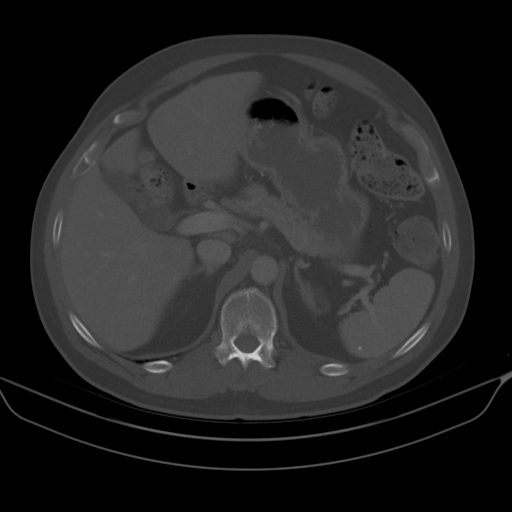
[im 83/100  soft-tissue]
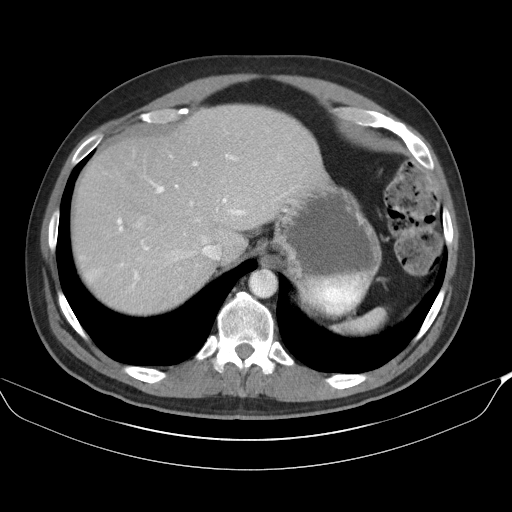
[im 83/100  lung]
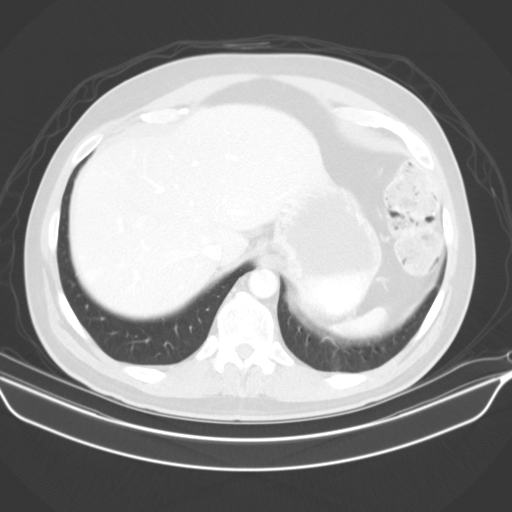
[im 87/100  lung]
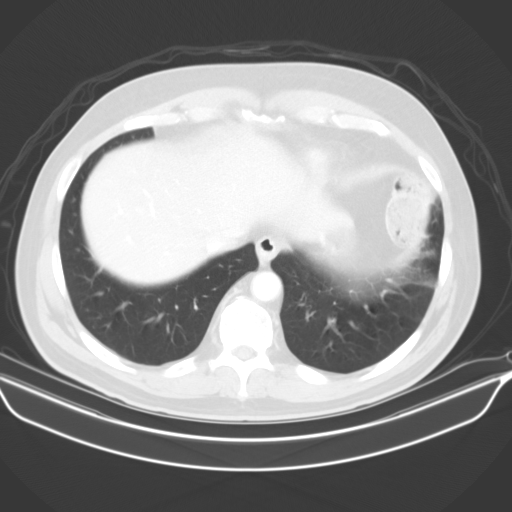
[im 91/100  soft-tissue]
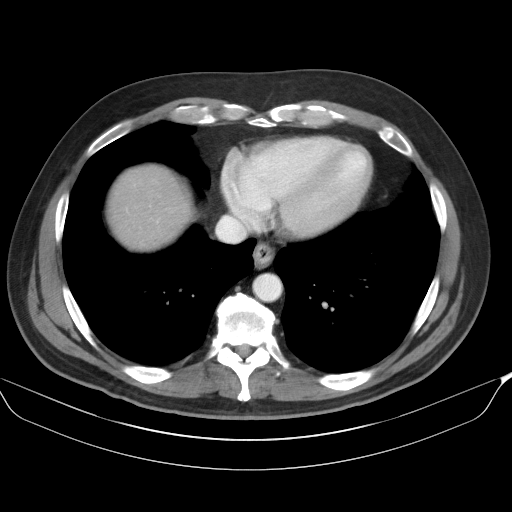
[im 91/100  lung]
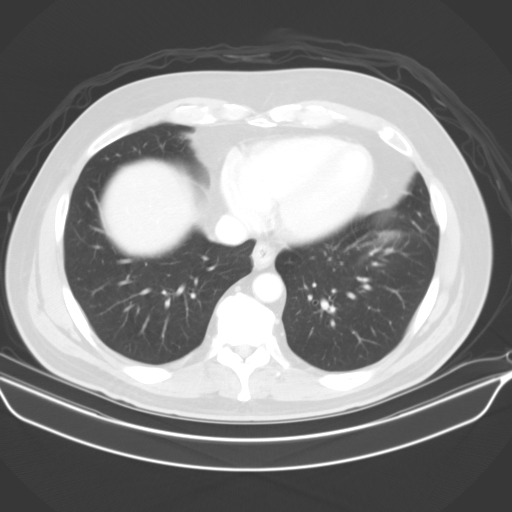
[im 95/100  lung]
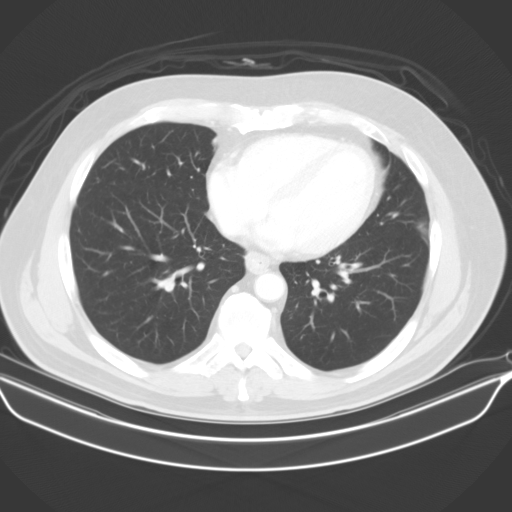

[13 of 32 positions shown; findings below may reference images not displayed]

FINDINGS: Lower chest: There is scarring in the inferior lingula and anterior
segment left lower lobe. No lung base edema or consolidation
evident.

Hepatobiliary: No focal liver lesions are appreciable. Gallbladder
wall is not appreciably thickened. There is no biliary duct
dilatation.

Pancreas: No pancreatic mass or inflammatory focus.

Spleen: There are scattered calcified splenic granulomas. Spleen
otherwise appears unremarkable.

Adrenals/Urinary Tract: Adrenals bilaterally appear unremarkable.
Kidneys bilaterally show no evident mass or hydronephrosis on either
side. There is no renal or ureteral calculus on either side. Urinary
bladder is midline with wall thickness within normal limits.

Stomach/Bowel: There is diffuse stool throughout the colon. There is
no appreciable bowel wall or mesenteric thickening. No evidence of
fistula or ulceration. No evident bowel obstruction. No free air or
portal venous air.

Vascular/Lymphatic: There are foci of atherosclerotic calcification
in the aorta and common iliac arteries. Major mesenteric vessels
appear patent. No adenopathy is appreciable in the abdomen or
pelvis.

Reproductive: There are multiple prostatic calculi. Prostate and
seminal vesicles appear normal in size and contour. No evident
pelvic mass.

Other: The appendix is dilated and filled with mucus type material.
The appendix inverts into the cecum, felt to account for the
abnormal lesion seen in the cecum on recent colonoscopy. This
appearance is consistent with an appendiceal mucocele. A small
amount of calcification is noted in the wall of the distal aspect of
this appendiceal mucocele. There is no surrounding mesenteric
inflammatory change. No free air or abnormal fluid/abscess in the
periappendiceal region. The mucocele at the level of the cecum has a
maximum transverse diameter of 3.0 cm.

There is no ascites or abscess in the abdomen or pelvis. There is a
small ventral hernia containing only fat.

Musculoskeletal: There is moderate spinal stenosis at L4-5 with
slightly milder spinal stenosis at L3-4 due to diffuse disc
protrusion and bony hypertrophy. There are foci of degenerative
change in the lumbar spine. There are no blastic or lytic bone
lesions. There is no intramuscular or abdominal wall lesion.
IMPRESSION: 1. Dilated appendix containing mucus consistent with appendiceal
mucocele. A portion of the proximal appendiceal mucocele extends
into the cecum without obstruction. A small amount of calcification
is noted in the wall of the appendiceal mucocele. There is no
surrounding inflammation or fluid. Note that while no neoplastic
features are evident with this lesion by CT, appendiceal mucoceles
may undergo malignant degeneration and potentially may rupture
resulting in pseudomyxoma peritonei. In this regard, surgical
consultation is advised.

The appendiceal mucocele arises at the cecum at the superior aspect
of the iliac crest on the right extending medially and inferiorly.
Its maximum diameter is in the cecum measuring 3.0 cm. In its
midportion, the mucocele has a transverse diameter of 2.6 cm.

2. Diffuse stool in colon. No bowel obstruction. No abscess in the
abdomen or pelvis. No ascites.

3. No renal or ureteral calculus. No hydronephrosis. There are
multiple prostatic calculi.

4.  Aortoiliac atherosclerosis.

5.  Small ventral hernia containing only fat.

6.  Calcified splenic granulomas noted.

7.  Small ventral hernia containing only fat.

Aortic Atherosclerosis ([id number]-ND0.0).

These results will be called to the ordering clinician or
representative by the Radiologist Assistant, and communication
documented in the PACS or zVision Dashboard.

## 2018-09-21 ENCOUNTER — Other Ambulatory Visit: Payer: Self-pay | Admitting: Nurse Practitioner

## 2018-09-22 ENCOUNTER — Other Ambulatory Visit: Payer: Self-pay | Admitting: Nurse Practitioner

## 2018-09-22 DIAGNOSIS — K7469 Other cirrhosis of liver: Secondary | ICD-10-CM

## 2018-09-29 ENCOUNTER — Ambulatory Visit
Admission: RE | Admit: 2018-09-29 | Discharge: 2018-09-29 | Disposition: A | Discharge status: Home / Self Care | Payer: 59 | Source: Ambulatory Visit | Attending: Nurse Practitioner | Admitting: Nurse Practitioner

## 2018-09-29 DIAGNOSIS — K7469 Other cirrhosis of liver: Secondary | ICD-10-CM

## 2019-01-24 ENCOUNTER — Other Ambulatory Visit: Payer: Self-pay | Admitting: Nurse Practitioner

## 2019-01-24 DIAGNOSIS — K7469 Other cirrhosis of liver: Secondary | ICD-10-CM

## 2019-02-01 ENCOUNTER — Ambulatory Visit
Admission: RE | Admit: 2019-02-01 | Discharge: 2019-02-01 | Disposition: A | Discharge status: Home / Self Care | Payer: 59 | Source: Ambulatory Visit | Attending: Nurse Practitioner | Admitting: Nurse Practitioner

## 2019-02-01 DIAGNOSIS — K7469 Other cirrhosis of liver: Secondary | ICD-10-CM

## 2019-07-24 ENCOUNTER — Other Ambulatory Visit: Payer: Self-pay | Admitting: Nurse Practitioner

## 2019-07-24 DIAGNOSIS — K7469 Other cirrhosis of liver: Secondary | ICD-10-CM

## 2019-07-26 ENCOUNTER — Other Ambulatory Visit: Payer: 59

## 2019-08-01 ENCOUNTER — Other Ambulatory Visit: Payer: Self-pay

## 2019-08-01 ENCOUNTER — Ambulatory Visit
Admission: RE | Admit: 2019-08-01 | Discharge: 2019-08-01 | Disposition: A | Discharge status: Home / Self Care | Payer: 59 | Source: Ambulatory Visit | Attending: Nurse Practitioner | Admitting: Nurse Practitioner

## 2019-08-01 DIAGNOSIS — K7469 Other cirrhosis of liver: Secondary | ICD-10-CM

## 2020-02-05 ENCOUNTER — Other Ambulatory Visit: Payer: Self-pay | Admitting: Nurse Practitioner

## 2020-02-05 DIAGNOSIS — K7469 Other cirrhosis of liver: Secondary | ICD-10-CM

## 2020-02-08 ENCOUNTER — Ambulatory Visit
Admission: RE | Admit: 2020-02-08 | Discharge: 2020-02-08 | Disposition: A | Discharge status: Home / Self Care | Payer: 59 | Source: Ambulatory Visit | Attending: Nurse Practitioner | Admitting: Nurse Practitioner

## 2020-02-08 DIAGNOSIS — K7469 Other cirrhosis of liver: Secondary | ICD-10-CM

## 2020-08-04 ENCOUNTER — Other Ambulatory Visit: Payer: Self-pay | Admitting: Nurse Practitioner

## 2020-08-04 DIAGNOSIS — K7469 Other cirrhosis of liver: Secondary | ICD-10-CM

## 2020-08-11 ENCOUNTER — Ambulatory Visit
Admission: RE | Admit: 2020-08-11 | Discharge: 2020-08-11 | Disposition: A | Discharge status: Home / Self Care | Payer: Medicare Other | Source: Ambulatory Visit | Attending: Nurse Practitioner | Admitting: Nurse Practitioner

## 2020-08-11 DIAGNOSIS — K7469 Other cirrhosis of liver: Secondary | ICD-10-CM

## 2021-02-04 ENCOUNTER — Other Ambulatory Visit: Payer: Self-pay | Admitting: Nurse Practitioner

## 2021-02-04 DIAGNOSIS — K7469 Other cirrhosis of liver: Secondary | ICD-10-CM

## 2021-02-19 ENCOUNTER — Other Ambulatory Visit: Payer: Self-pay

## 2021-02-19 ENCOUNTER — Ambulatory Visit
Admission: RE | Admit: 2021-02-19 | Discharge: 2021-02-19 | Disposition: A | Discharge status: Home / Self Care | Payer: Medicare Other | Source: Ambulatory Visit | Attending: Nurse Practitioner | Admitting: Nurse Practitioner

## 2021-02-19 DIAGNOSIS — K7469 Other cirrhosis of liver: Secondary | ICD-10-CM

## 2021-08-05 ENCOUNTER — Other Ambulatory Visit: Payer: Self-pay | Admitting: Nurse Practitioner

## 2021-08-05 DIAGNOSIS — K7469 Other cirrhosis of liver: Secondary | ICD-10-CM

## 2021-08-19 ENCOUNTER — Ambulatory Visit
Admission: RE | Admit: 2021-08-19 | Discharge: 2021-08-19 | Disposition: A | Discharge status: Home / Self Care | Payer: Medicare Other | Source: Ambulatory Visit | Attending: Nurse Practitioner | Admitting: Nurse Practitioner

## 2021-08-19 DIAGNOSIS — K7469 Other cirrhosis of liver: Secondary | ICD-10-CM

## 2022-02-08 ENCOUNTER — Other Ambulatory Visit: Payer: Self-pay | Admitting: Nurse Practitioner

## 2022-02-08 DIAGNOSIS — K7469 Other cirrhosis of liver: Secondary | ICD-10-CM

## 2022-02-18 ENCOUNTER — Other Ambulatory Visit: Payer: Self-pay

## 2022-02-18 ENCOUNTER — Ambulatory Visit
Admission: RE | Admit: 2022-02-18 | Discharge: 2022-02-18 | Disposition: A | Discharge status: Home / Self Care | Payer: Medicare Other | Source: Ambulatory Visit | Attending: Nurse Practitioner | Admitting: Nurse Practitioner

## 2022-02-18 DIAGNOSIS — K7469 Other cirrhosis of liver: Secondary | ICD-10-CM

## 2022-09-01 ENCOUNTER — Other Ambulatory Visit: Payer: Self-pay | Admitting: Nurse Practitioner

## 2022-09-01 DIAGNOSIS — K7469 Other cirrhosis of liver: Secondary | ICD-10-CM

## 2022-09-03 ENCOUNTER — Ambulatory Visit
Admission: RE | Admit: 2022-09-03 | Discharge: 2022-09-03 | Disposition: A | Discharge status: Home / Self Care | Payer: Medicare Other | Source: Ambulatory Visit | Attending: Nurse Practitioner | Admitting: Nurse Practitioner

## 2022-09-03 DIAGNOSIS — K7469 Other cirrhosis of liver: Secondary | ICD-10-CM

## 2023-03-02 ENCOUNTER — Other Ambulatory Visit: Payer: Self-pay | Admitting: Nurse Practitioner

## 2023-03-02 DIAGNOSIS — K760 Fatty (change of) liver, not elsewhere classified: Secondary | ICD-10-CM

## 2023-03-02 DIAGNOSIS — K7469 Other cirrhosis of liver: Secondary | ICD-10-CM

## 2023-03-25 ENCOUNTER — Ambulatory Visit
Admission: RE | Admit: 2023-03-25 | Discharge: 2023-03-25 | Disposition: A | Discharge status: Home / Self Care | Payer: Medicare Other | Source: Ambulatory Visit | Attending: Nurse Practitioner | Admitting: Nurse Practitioner

## 2023-03-25 DIAGNOSIS — K7469 Other cirrhosis of liver: Secondary | ICD-10-CM

## 2023-03-25 DIAGNOSIS — K760 Fatty (change of) liver, not elsewhere classified: Secondary | ICD-10-CM

## 2023-08-31 ENCOUNTER — Other Ambulatory Visit: Payer: Self-pay | Admitting: Nurse Practitioner

## 2023-08-31 DIAGNOSIS — K7469 Other cirrhosis of liver: Secondary | ICD-10-CM

## 2023-09-02 ENCOUNTER — Ambulatory Visit
Admission: RE | Admit: 2023-09-02 | Discharge: 2023-09-02 | Disposition: A | Discharge status: Home / Self Care | Payer: Medicare Other | Source: Ambulatory Visit | Attending: Nurse Practitioner | Admitting: Nurse Practitioner

## 2023-09-02 DIAGNOSIS — K7469 Other cirrhosis of liver: Secondary | ICD-10-CM

## 2024-04-25 ENCOUNTER — Other Ambulatory Visit: Payer: Self-pay | Admitting: Nurse Practitioner

## 2024-04-25 DIAGNOSIS — K7469 Other cirrhosis of liver: Secondary | ICD-10-CM

## 2024-04-25 DIAGNOSIS — K760 Fatty (change of) liver, not elsewhere classified: Secondary | ICD-10-CM

## 2024-04-30 ENCOUNTER — Ambulatory Visit
Admission: RE | Admit: 2024-04-30 | Discharge: 2024-04-30 | Disposition: A | Discharge status: Home / Self Care | Source: Ambulatory Visit | Attending: Nurse Practitioner | Admitting: Nurse Practitioner

## 2024-04-30 DIAGNOSIS — K7469 Other cirrhosis of liver: Secondary | ICD-10-CM

## 2024-04-30 DIAGNOSIS — K760 Fatty (change of) liver, not elsewhere classified: Secondary | ICD-10-CM

## 2024-10-30 ENCOUNTER — Other Ambulatory Visit: Payer: Self-pay | Admitting: Nurse Practitioner

## 2024-10-30 DIAGNOSIS — K7469 Other cirrhosis of liver: Secondary | ICD-10-CM

## 2024-10-30 DIAGNOSIS — K760 Fatty (change of) liver, not elsewhere classified: Secondary | ICD-10-CM

## 2024-10-31 ENCOUNTER — Ambulatory Visit
Admission: RE | Admit: 2024-10-31 | Discharge: 2024-10-31 | Disposition: A | Discharge status: Home / Self Care | Source: Ambulatory Visit | Attending: Nurse Practitioner | Admitting: Nurse Practitioner

## 2024-10-31 DIAGNOSIS — K7469 Other cirrhosis of liver: Secondary | ICD-10-CM

## 2024-10-31 DIAGNOSIS — K760 Fatty (change of) liver, not elsewhere classified: Secondary | ICD-10-CM
